# Patient Record
Sex: Male | Born: 1980 | Race: White | Hispanic: No | Marital: Single | Smoking: Current some day smoker
Health system: Southern US, Community
[De-identification: ages and names within clinical notes are randomized; demographics above are authoritative.]

## PROBLEM LIST (undated history)

## (undated) DIAGNOSIS — T8859XA Other complications of anesthesia, initial encounter: Secondary | ICD-10-CM

## (undated) DIAGNOSIS — K219 Gastro-esophageal reflux disease without esophagitis: Secondary | ICD-10-CM

## (undated) DIAGNOSIS — J189 Pneumonia, unspecified organism: Secondary | ICD-10-CM

## (undated) HISTORY — PX: LYMPHADENECTOMY: SHX15

---
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
## 2000-05-09 ENCOUNTER — Ambulatory Visit (HOSPITAL_COMMUNITY): Admission: RE | Admit: 2000-05-09 | Discharge: 2000-05-09 | Payer: Self-pay | Admitting: Orthopedic Surgery

## 2000-05-09 ENCOUNTER — Encounter: Payer: Self-pay | Admitting: Orthopedic Surgery

## 2000-05-23 ENCOUNTER — Ambulatory Visit (HOSPITAL_COMMUNITY): Admission: RE | Admit: 2000-05-23 | Discharge: 2000-05-23 | Payer: Self-pay | Admitting: Orthopedic Surgery

## 2000-05-23 ENCOUNTER — Encounter: Payer: Self-pay | Admitting: Orthopedic Surgery

## 2000-07-30 ENCOUNTER — Encounter: Payer: Self-pay | Admitting: Orthopedic Surgery

## 2000-07-30 ENCOUNTER — Ambulatory Visit (HOSPITAL_COMMUNITY): Admission: RE | Admit: 2000-07-30 | Discharge: 2000-07-30 | Payer: Self-pay | Admitting: Orthopedic Surgery

## 2000-10-28 ENCOUNTER — Emergency Department (HOSPITAL_COMMUNITY): Admission: EM | Admit: 2000-10-28 | Discharge: 2000-10-28 | Payer: Self-pay | Admitting: Emergency Medicine

## 2001-01-05 ENCOUNTER — Emergency Department (HOSPITAL_COMMUNITY): Admission: AC | Admit: 2001-01-05 | Discharge: 2001-01-05 | Payer: Self-pay

## 2001-03-17 ENCOUNTER — Encounter: Admission: RE | Admit: 2001-03-17 | Discharge: 2001-03-17 | Payer: Self-pay | Admitting: Internal Medicine

## 2001-09-29 ENCOUNTER — Encounter: Payer: Self-pay | Admitting: Emergency Medicine

## 2001-09-29 ENCOUNTER — Emergency Department (HOSPITAL_COMMUNITY): Admission: EM | Admit: 2001-09-29 | Discharge: 2001-09-29 | Payer: Self-pay | Admitting: Emergency Medicine

## 2002-03-16 ENCOUNTER — Emergency Department (HOSPITAL_COMMUNITY): Admission: EM | Admit: 2002-03-16 | Discharge: 2002-03-16 | Payer: Self-pay

## 2003-01-13 ENCOUNTER — Encounter: Payer: Self-pay | Admitting: Emergency Medicine

## 2003-01-13 ENCOUNTER — Emergency Department (HOSPITAL_COMMUNITY): Admission: AC | Admit: 2003-01-13 | Discharge: 2003-01-13 | Payer: Self-pay

## 2003-05-03 ENCOUNTER — Emergency Department (HOSPITAL_COMMUNITY): Admission: AD | Admit: 2003-05-03 | Discharge: 2003-05-03 | Payer: Self-pay | Admitting: Family Medicine

## 2003-08-07 ENCOUNTER — Emergency Department (HOSPITAL_COMMUNITY): Admission: AD | Admit: 2003-08-07 | Discharge: 2003-08-07 | Payer: Self-pay | Admitting: Family Medicine

## 2003-09-15 ENCOUNTER — Emergency Department (HOSPITAL_COMMUNITY): Admission: AD | Admit: 2003-09-15 | Discharge: 2003-09-15 | Payer: Self-pay | Admitting: Family Medicine

## 2004-03-31 ENCOUNTER — Emergency Department (HOSPITAL_COMMUNITY): Admission: EM | Admit: 2004-03-31 | Discharge: 2004-03-31 | Payer: Self-pay | Admitting: Emergency Medicine

## 2004-09-09 ENCOUNTER — Emergency Department (HOSPITAL_COMMUNITY): Admission: EM | Admit: 2004-09-09 | Discharge: 2004-09-09 | Payer: Self-pay | Admitting: Family Medicine

## 2005-03-06 ENCOUNTER — Emergency Department (HOSPITAL_COMMUNITY): Admission: EM | Admit: 2005-03-06 | Discharge: 2005-03-06 | Payer: Self-pay | Admitting: *Deleted

## 2005-04-05 ENCOUNTER — Emergency Department (HOSPITAL_COMMUNITY): Admission: AD | Admit: 2005-04-05 | Discharge: 2005-04-05 | Payer: Self-pay | Admitting: Family Medicine

## 2005-05-16 ENCOUNTER — Emergency Department (HOSPITAL_COMMUNITY): Admission: EM | Admit: 2005-05-16 | Discharge: 2005-05-16 | Payer: Self-pay | Admitting: *Deleted

## 2005-12-21 ENCOUNTER — Emergency Department (HOSPITAL_COMMUNITY): Admission: EM | Admit: 2005-12-21 | Discharge: 2005-12-21 | Payer: Self-pay | Admitting: Family Medicine

## 2006-06-16 ENCOUNTER — Ambulatory Visit: Payer: Self-pay | Admitting: Internal Medicine

## 2006-06-22 ENCOUNTER — Encounter: Admission: RE | Admit: 2006-06-22 | Discharge: 2006-06-22 | Payer: Self-pay | Admitting: Internal Medicine

## 2007-02-02 ENCOUNTER — Emergency Department (HOSPITAL_COMMUNITY): Admission: EM | Admit: 2007-02-02 | Discharge: 2007-02-02 | Payer: Self-pay | Admitting: Family Medicine

## 2007-02-07 ENCOUNTER — Emergency Department (HOSPITAL_COMMUNITY): Admission: EM | Admit: 2007-02-07 | Discharge: 2007-02-07 | Payer: Self-pay | Admitting: Emergency Medicine

## 2007-02-14 ENCOUNTER — Emergency Department (HOSPITAL_COMMUNITY): Admission: EM | Admit: 2007-02-14 | Discharge: 2007-02-14 | Payer: Self-pay | Admitting: Emergency Medicine

## 2007-02-18 ENCOUNTER — Emergency Department (HOSPITAL_COMMUNITY): Admission: EM | Admit: 2007-02-18 | Discharge: 2007-02-18 | Payer: Self-pay | Admitting: Emergency Medicine

## 2007-12-01 ENCOUNTER — Emergency Department (HOSPITAL_COMMUNITY): Admission: EM | Admit: 2007-12-01 | Discharge: 2007-12-01 | Payer: Self-pay | Admitting: Family Medicine

## 2009-04-08 ENCOUNTER — Ambulatory Visit: Payer: Self-pay | Admitting: Interventional Radiology

## 2009-04-08 ENCOUNTER — Emergency Department (HOSPITAL_BASED_OUTPATIENT_CLINIC_OR_DEPARTMENT_OTHER): Admission: EM | Admit: 2009-04-08 | Discharge: 2009-04-08 | Payer: Self-pay | Admitting: Emergency Medicine

## 2009-11-15 ENCOUNTER — Emergency Department: Payer: Self-pay | Admitting: Emergency Medicine

## 2010-03-19 ENCOUNTER — Emergency Department (HOSPITAL_COMMUNITY): Admission: EM | Admit: 2010-03-19 | Discharge: 2010-03-19 | Payer: Self-pay | Admitting: Emergency Medicine

## 2010-05-18 ENCOUNTER — Emergency Department (HOSPITAL_COMMUNITY): Admission: EM | Admit: 2010-05-18 | Discharge: 2010-05-18 | Payer: Self-pay | Admitting: Family Medicine

## 2010-09-18 ENCOUNTER — Emergency Department (HOSPITAL_COMMUNITY)
Admission: EM | Admit: 2010-09-18 | Discharge: 2010-09-18 | Disposition: A | Discharge status: Home / Self Care | Payer: Self-pay | Attending: Emergency Medicine | Admitting: Emergency Medicine

## 2010-09-18 DIAGNOSIS — J069 Acute upper respiratory infection, unspecified: Secondary | ICD-10-CM | POA: Insufficient documentation

## 2010-09-18 DIAGNOSIS — B9789 Other viral agents as the cause of diseases classified elsewhere: Secondary | ICD-10-CM | POA: Insufficient documentation

## 2010-09-18 DIAGNOSIS — J3489 Other specified disorders of nose and nasal sinuses: Secondary | ICD-10-CM | POA: Insufficient documentation

## 2010-09-18 DIAGNOSIS — L2989 Other pruritus: Secondary | ICD-10-CM | POA: Insufficient documentation

## 2010-09-18 DIAGNOSIS — R070 Pain in throat: Secondary | ICD-10-CM | POA: Insufficient documentation

## 2010-09-18 DIAGNOSIS — H5789 Other specified disorders of eye and adnexa: Secondary | ICD-10-CM | POA: Insufficient documentation

## 2010-09-18 DIAGNOSIS — L298 Other pruritus: Secondary | ICD-10-CM | POA: Insufficient documentation

## 2010-09-18 LAB — RAPID STREP SCREEN (MED CTR MEBANE ONLY): Streptococcus, Group A Screen (Direct): NEGATIVE

## 2010-10-03 LAB — POCT I-STAT, CHEM 8
BUN: 11 mg/dL (ref 6–23)
Chloride: 103 mEq/L (ref 96–112)
Creatinine, Ser: 1.2 mg/dL (ref 0.4–1.5)
TCO2: 29 mmol/L (ref 0–100)

## 2010-10-03 LAB — DIFFERENTIAL
Lymphocytes Relative: 25 % (ref 12–46)
Monocytes Absolute: 0.7 10*3/uL (ref 0.1–1.0)
Neutrophils Relative %: 67 % (ref 43–77)

## 2010-10-03 LAB — CBC
HCT: 48 % (ref 39.0–52.0)
Hemoglobin: 17 g/dL (ref 13.0–17.0)
RBC: 5.14 MIL/uL (ref 4.22–5.81)
WBC: 9.8 10*3/uL (ref 4.0–10.5)

## 2010-10-24 LAB — URINALYSIS, ROUTINE W REFLEX MICROSCOPIC
Ketones, ur: 15 mg/dL — AB
Leukocytes, UA: NEGATIVE
Nitrite: NEGATIVE
Protein, ur: NEGATIVE mg/dL
Specific Gravity, Urine: 1.036 — ABNORMAL HIGH (ref 1.005–1.030)
Urobilinogen, UA: 1 mg/dL (ref 0.0–1.0)

## 2010-10-24 LAB — URINE MICROSCOPIC-ADD ON

## 2011-03-29 ENCOUNTER — Emergency Department (HOSPITAL_COMMUNITY)
Admission: EM | Admit: 2011-03-29 | Discharge: 2011-03-29 | Disposition: A | Discharge status: Home / Self Care | Payer: Self-pay | Attending: Emergency Medicine | Admitting: Emergency Medicine

## 2011-03-29 ENCOUNTER — Emergency Department (HOSPITAL_COMMUNITY): Payer: Self-pay

## 2011-03-29 DIAGNOSIS — R0602 Shortness of breath: Secondary | ICD-10-CM | POA: Insufficient documentation

## 2011-03-29 DIAGNOSIS — R0789 Other chest pain: Secondary | ICD-10-CM | POA: Insufficient documentation

## 2011-03-29 DIAGNOSIS — R42 Dizziness and giddiness: Secondary | ICD-10-CM | POA: Insufficient documentation

## 2011-03-29 DIAGNOSIS — R110 Nausea: Secondary | ICD-10-CM | POA: Insufficient documentation

## 2011-03-29 LAB — POCT I-STAT, CHEM 8
Calcium, Ion: 1.19 mmol/L (ref 1.12–1.32)
Chloride: 104 mEq/L (ref 96–112)
HCT: 49 % (ref 39.0–52.0)
Potassium: 4.2 mEq/L (ref 3.5–5.1)
Sodium: 138 mEq/L (ref 135–145)

## 2011-03-29 LAB — POCT I-STAT TROPONIN I: Troponin i, poc: 0 ng/mL (ref 0.00–0.08)

## 2011-04-02 ENCOUNTER — Emergency Department (HOSPITAL_COMMUNITY)
Admission: EM | Admit: 2011-04-02 | Discharge: 2011-04-03 | Disposition: A | Discharge status: Home / Self Care | Payer: Self-pay | Attending: Emergency Medicine | Admitting: Emergency Medicine

## 2011-04-02 DIAGNOSIS — N5089 Other specified disorders of the male genital organs: Secondary | ICD-10-CM | POA: Insufficient documentation

## 2011-04-02 DIAGNOSIS — R109 Unspecified abdominal pain: Secondary | ICD-10-CM | POA: Insufficient documentation

## 2011-04-02 DIAGNOSIS — N509 Disorder of male genital organs, unspecified: Secondary | ICD-10-CM | POA: Insufficient documentation

## 2011-04-02 LAB — URINALYSIS, ROUTINE W REFLEX MICROSCOPIC
Protein, ur: NEGATIVE mg/dL
Urobilinogen, UA: 1 mg/dL (ref 0.0–1.0)

## 2011-04-02 LAB — URINE MICROSCOPIC-ADD ON

## 2011-04-04 LAB — URINE CULTURE: Culture  Setup Time: 201209140607

## 2012-02-14 ENCOUNTER — Emergency Department (HOSPITAL_COMMUNITY)
Admission: EM | Admit: 2012-02-14 | Discharge: 2012-02-14 | Disposition: A | Discharge status: Home / Self Care | Payer: Self-pay | Attending: Emergency Medicine | Admitting: Emergency Medicine

## 2012-02-14 ENCOUNTER — Encounter (HOSPITAL_COMMUNITY): Payer: Self-pay | Admitting: *Deleted

## 2012-02-14 DIAGNOSIS — F17200 Nicotine dependence, unspecified, uncomplicated: Secondary | ICD-10-CM | POA: Insufficient documentation

## 2012-02-14 DIAGNOSIS — R12 Heartburn: Secondary | ICD-10-CM | POA: Insufficient documentation

## 2012-02-14 LAB — COMPREHENSIVE METABOLIC PANEL
Alkaline Phosphatase: 69 U/L (ref 39–117)
BUN: 11 mg/dL (ref 6–23)
Calcium: 9.6 mg/dL (ref 8.4–10.5)
GFR calc Af Amer: >90 mL/min (ref >90)
GFR calc non Af Amer: >90 mL/min (ref >90)
Glucose, Bld: 89 mg/dL (ref 70–99)
Total Protein: 8 g/dL (ref 6.0–8.3)

## 2012-02-14 LAB — URINALYSIS, ROUTINE W REFLEX MICROSCOPIC
Bilirubin Urine: NEGATIVE
Glucose, UA: NEGATIVE mg/dL
Hgb urine dipstick: NEGATIVE
Ketones, ur: NEGATIVE mg/dL
pH: 6 (ref 5.0–8.0)

## 2012-02-14 LAB — CBC
HCT: 46.8 % (ref 39.0–52.0)
Hemoglobin: 16.1 g/dL (ref 13.0–17.0)
MCH: 31.9 pg (ref 26.0–34.0)
MCHC: 34.4 g/dL (ref 30.0–36.0)

## 2012-02-14 MED ORDER — PANTOPRAZOLE SODIUM 40 MG PO TBEC: 40.0000 mg | DELAYED_RELEASE_TABLET | Freq: Every day | ORAL | Status: DC

## 2012-02-14 MED ORDER — DICYCLOMINE HCL 20 MG PO TABS: 20.0000 mg | ORAL_TABLET | Freq: Two times a day (BID) | ORAL | Status: DC

## 2012-02-14 NOTE — ED Notes (Signed)
Reports having generalized abd pain and diarrhea, body chills, reports recently having small amount of blood in his stools. Denies vomiting. No acute distress noted at triage.

## 2012-02-14 NOTE — ED Provider Notes (Signed)
History     CSN: [id number]  Arrival date & time 02/14/12  1702   First MD Initiated Contact with Patient 02/14/12 2239     11:10 PM HPI Pt reports for the last few weeks has had intermittent upper abdominal pain. Describes as burning pain. Reports currently is having absolutely no symptoms. States every so often will have blood in stools the last stool was normal. States pain is worse occasionally after having fast food. Denies melena, nausea, vomiting, fever, diarrhea, back pain. 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  Patient is a 31 y.o. male presenting with abdominal pain. The history is provided by the patient.  Abdominal Pain The primary symptoms of the illness include abdominal pain. The primary symptoms of the illness do not include fever, shortness of breath, nausea, vomiting, diarrhea, hematemesis or dysuria. The current episode started more than 2 days ago. The onset of the illness was gradual. The problem has not changed since onset. The abdominal pain is located in the epigastric region. The abdominal pain does not radiate. The severity of the abdominal pain is 0/10. The abdominal pain is exacerbated by eating.  The illness is associated with eating. The patient has not had a change in bowel habit. Symptoms associated with the illness do not include chills, anorexia, diaphoresis, constipation, urgency, hematuria, frequency or back pain.    History reviewed. No pertinent past medical history.  History reviewed. No pertinent past surgical history.  History reviewed. No pertinent family history.  History  Substance Use Topics  . Smoking status: Current Everyday Smoker    Types: Cigarettes  . Smokeless tobacco: Not on file  . Alcohol Use: Yes     occ      Review of Systems  Constitutional: Negative for fever, chills and diaphoresis.  Respiratory: Negative for shortness of breath.     Cardiovascular: Negative for chest pain.  Gastrointestinal: Positive for abdominal pain. Negative for nausea, vomiting, diarrhea, constipation, blood in stool, rectal pain, anorexia and hematemesis.  Genitourinary: Negative for dysuria, urgency, frequency, hematuria, flank pain, discharge, penile pain and testicular pain.  Musculoskeletal: Negative for back pain.  Neurological: Negative for dizziness, weakness, numbness and headaches.  All other systems reviewed and are negative.    Allergies  Review of patient's allergies indicates no known allergies.  Home Medications   Current Outpatient Rx  Name Route Sig Dispense Refill  . LOPERAMIDE HCL 2 MG PO CAPS Oral Take 2 mg by mouth 4 (four) times daily as needed. For diarrhea      BP 137/82  Pulse 73  Temp 98.3 F (36.8 C) (Oral)  Resp 18  SpO2 100%  Physical Exam  Vitals reviewed. Constitutional: He is oriented to person, place, and time. He appears well-developed and well-nourished.  HENT:  Head: Normocephalic and atraumatic.  Eyes: Conjunctivae are normal. Pupils are equal, round, and reactive to light.  Neck: Normal range of motion. Neck supple.  Cardiovascular: Normal rate, regular rhythm and normal heart sounds.   Pulmonary/Chest: Effort normal and breath sounds normal.  Abdominal: Soft. Bowel sounds are normal. He exhibits no distension and no mass. There is no tenderness. There is no rebound and no guarding.  Neurological: He is alert and oriented to person, place, and time.  Skin: Skin is warm and dry. No rash noted. No erythema. No pallor.  Psychiatric: He has a normal mood and affect. His behavior  is normal.    ED Course  Procedures   Results for orders placed during the hospital encounter of 02/14/12  URINALYSIS, ROUTINE W REFLEX MICROSCOPIC      Component Value Range   Color, Urine YELLOW  YELLOW   APPearance CLEAR  CLEAR   Specific Gravity, Urine 1.021  1.005 - 1.030   pH 6.0  5.0 - 8.0   Glucose, UA  NEGATIVE  NEGATIVE mg/dL   Hgb urine dipstick NEGATIVE  NEGATIVE   Bilirubin Urine NEGATIVE  NEGATIVE   Ketones, ur NEGATIVE  NEGATIVE mg/dL   Protein, ur NEGATIVE  NEGATIVE mg/dL   Urobilinogen, UA 0.2  0.0 - 1.0 mg/dL   Nitrite NEGATIVE  NEGATIVE   Leukocytes, UA NEGATIVE  NEGATIVE  CBC      Component Value Range   WBC 9.9  4.0 - 10.5 K/uL   RBC 5.05  4.22 - 5.81 MIL/uL   Hemoglobin 16.1  13.0 - 17.0 g/dL   HCT 16.1  09.6 - 04.5 %   MCV 92.7  78.0 - 100.0 fL   MCH 31.9  26.0 - 34.0 pg   MCHC 34.4  30.0 - 36.0 g/dL   RDW 40.9  81.1 - 91.4 %   Platelets 247  150 - 400 K/uL  COMPREHENSIVE METABOLIC PANEL      Component Value Range   Sodium 138  135 - 145 mEq/L   Potassium 4.3  3.5 - 5.1 mEq/L   Chloride 102  96 - 112 mEq/L   CO2 27  19 - 32 mEq/L   Glucose, Bld 89  70 - 99 mg/dL   BUN 11  6 - 23 mg/dL   Creatinine, Ser 7.82  0.50 - 1.35 mg/dL   Calcium 9.6  8.4 - 95.6 mg/dL   Total Protein 8.0  6.0 - 8.3 g/dL   Albumin 4.2  3.5 - 5.2 g/dL   AST 32  0 - 37 U/L   ALT 43  0 - 53 U/L   Alkaline Phosphatase 69  39 - 117 U/L   Total Bilirubin 0.3  0.3 - 1.2 mg/dL   GFR calc non Af Amer >90  >90 mL/min   GFR calc Af Amer >90  >90 mL/min    MDM    Patient is completely asymptomatic. States pain is mildly occurs but he just requires a medical physician. CBC, CMP, and urinalysis are all normal. I do not feel we need further testing. 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 Recommended if symptoms worsen he may always return to the nearest emergency room for further evaluation or feel that he may be discharged with prescription for Protonix and Carafate. Discussed with patient he voices understanding and is ready for discharge       Thomasene Lot, Cordelia Poche 02/14/12 2321

## 2012-02-15 NOTE — ED Provider Notes (Signed)
Medical screening examination/treatment/procedure(s) were performed by non-physician practitioner and as supervising physician I was immediately available for consultation/collaboration.   Carleene Cooper III, MD 02/15/12 1351

## 2012-05-08 ENCOUNTER — Emergency Department (HOSPITAL_COMMUNITY): Admission: EM | Admit: 2012-05-08 | Discharge: 2012-05-08 | Payer: Self-pay | Source: Home / Self Care

## 2012-05-08 ENCOUNTER — Emergency Department (INDEPENDENT_AMBULATORY_CARE_PROVIDER_SITE_OTHER)
Admission: EM | Admit: 2012-05-08 | Discharge: 2012-05-08 | Disposition: A | Discharge status: Home / Self Care | Payer: Self-pay | Source: Home / Self Care | Attending: Emergency Medicine | Admitting: Emergency Medicine

## 2012-05-08 ENCOUNTER — Emergency Department (INDEPENDENT_AMBULATORY_CARE_PROVIDER_SITE_OTHER): Payer: Self-pay

## 2012-05-08 ENCOUNTER — Encounter (HOSPITAL_COMMUNITY): Payer: Self-pay | Admitting: Emergency Medicine

## 2012-05-08 DIAGNOSIS — J208 Acute bronchitis due to other specified organisms: Secondary | ICD-10-CM

## 2012-05-08 DIAGNOSIS — J209 Acute bronchitis, unspecified: Secondary | ICD-10-CM

## 2012-05-08 MED ORDER — BENZONATATE 200 MG PO CAPS: 200.0000 mg | ORAL_CAPSULE | Freq: Three times a day (TID) | ORAL | Status: DC | PRN

## 2012-05-08 MED ORDER — PREDNISONE 5 MG PO KIT: 1.0000 | PACK | Freq: Every day | ORAL | Status: DC

## 2012-05-08 MED ORDER — ALBUTEROL SULFATE HFA 108 (90 BASE) MCG/ACT IN AERS
INHALATION_SPRAY | RESPIRATORY_TRACT | Status: AC
  Filled 2012-05-08: qty 6.7

## 2012-05-08 MED ORDER — ALBUTEROL SULFATE HFA 108 (90 BASE) MCG/ACT IN AERS
2.0000 | INHALATION_SPRAY | RESPIRATORY_TRACT | Status: DC | PRN
  Administered 2012-05-08: 2 via RESPIRATORY_TRACT

## 2012-05-08 NOTE — ED Provider Notes (Signed)
Chief Complaint  Patient presents with  . Cough    History of Present Illness:   Paul Newton is a 31 year old male with a two-day history of a cough productive of white to yellow sputum, wheezing, and chest tightness. He denies any history of asthma. He does smoke a pack of cigarettes per day but would like to. He notes sternal chest pain which is worse with coughing. It radiates to the submammary area in the left. He feels dizzy and lightheaded, has had some sweats, and headache. He denies any nasal congestion, rhinorrhea, sore throat, or GI complaints.  Review of Systems:  Other than noted above, the patient denies any of the following symptoms. Systemic:  No fever, chills, sweats, fatigue, myalgias, headache, or anorexia. Eye:  No redness, pain or drainage. ENT:  No earache, ear congestion, nasal congestion, sneezing, rhinorrhea, sinus pressure, sinus pain, post nasal drip, or sore throat. Lungs:  No cough, sputum production, wheezing, shortness of breath, or chest pain. GI:  No abdominal pain, nausea, vomiting, or diarrhea.  PMFSH:  Past medical history, family history, social history, meds, and allergies were reviewed.  Physical Exam:   Vital signs:  BP 119/60  Pulse 100  Temp 99.2 F (37.3 C)  Resp 19  SpO2 98% General:  Alert, in no distress. Eye:  No conjunctival injection or drainage. Lids were normal. ENT:  TMs and canals were normal, without erythema or inflammation.  Nasal mucosa was clear and uncongested, without drainage.  Mucous membranes were moist.  Pharynx was clear, without exudate or drainage.  There were no oral ulcerations or lesions. Neck:  Supple, no adenopathy, tenderness or mass. Lungs:  No respiratory distress.  Lungs showed soft, widely scattered, expiratory wheezes bilaterally, there were no rales or rhonchi.  Heart:  Regular rhythm, without gallops, murmers or rubs. Skin:  Clear, warm, and dry, without rash or lesions.  Radiology:  Dg Chest 2 View  05/08/2012   *RADIOLOGY REPORT*  Clinical Data: Cough, wheezing  CHEST - 2 VIEW  Comparison: 03/29/2011  Findings: Cardiomediastinal silhouette is stable.  No acute infiltrate or pleural effusion.  No pulmonary edema.  Bony thorax is stable.  IMPRESSION: No active disease.   Original Report Authenticated By: Natasha Mead, M.D.    I reviewed the images independently and personally and concur with the radiologist's findings.  Course in Urgent Care Center:   He was given an albuterol HFA inhaler with instructions to use 2 puffs 4 times a day.  Assessment:  The encounter diagnosis was Viral bronchitis.  Plan:   1.  The following meds were prescribed:   New Prescriptions   BENZONATATE (TESSALON) 200 MG CAPSULE    Take 1 capsule (200 mg total) by mouth 3 (three) times daily as needed for cough.   PREDNISONE 5 MG KIT    Take 1 kit (5 mg total) by mouth daily after breakfast. Prednisone 5 mg 6 day dosepack.  Take as directed.   2.  The patient was instructed in symptomatic care and handouts were given. 3.  The patient was told to return if becoming worse in any way, if no better in 3 or 4 days, and given some red flag symptoms that would indicate earlier return.   Reuben Likes, MD 05/08/12 2119

## 2012-05-08 NOTE — ED Notes (Signed)
Pt c/o cough x1 day... Sx include: cough w/white/yellow sputum, wheezing, chest pain that starts at center and radiates to left side, dizziness, light headed.... Pt is alert sitting straight up on exam table w/sig other waiting in the room w/pt.

## 2012-05-13 NOTE — ED Notes (Signed)
Work noted printed and left at front desk for pt

## 2012-07-06 ENCOUNTER — Encounter (HOSPITAL_COMMUNITY): Payer: Self-pay

## 2012-07-06 ENCOUNTER — Emergency Department (INDEPENDENT_AMBULATORY_CARE_PROVIDER_SITE_OTHER)
Admission: EM | Admit: 2012-07-06 | Discharge: 2012-07-06 | Disposition: A | Discharge status: Home / Self Care | Payer: Self-pay | Source: Home / Self Care

## 2012-07-06 DIAGNOSIS — J019 Acute sinusitis, unspecified: Secondary | ICD-10-CM

## 2012-07-06 DIAGNOSIS — F17200 Nicotine dependence, unspecified, uncomplicated: Secondary | ICD-10-CM

## 2012-07-06 DIAGNOSIS — J40 Bronchitis, not specified as acute or chronic: Secondary | ICD-10-CM

## 2012-07-06 DIAGNOSIS — J309 Allergic rhinitis, unspecified: Secondary | ICD-10-CM

## 2012-07-06 DIAGNOSIS — H6590 Unspecified nonsuppurative otitis media, unspecified ear: Secondary | ICD-10-CM

## 2012-07-06 MED ORDER — ALBUTEROL SULFATE (5 MG/ML) 0.5% IN NEBU
INHALATION_SOLUTION | RESPIRATORY_TRACT | Status: AC
  Filled 2012-07-06: qty 1

## 2012-07-06 MED ORDER — FLUTICASONE PROPIONATE 50 MCG/ACT NA SUSP: 2.0000 | Freq: Every day | NASAL | Status: DC

## 2012-07-06 MED ORDER — DOXYCYCLINE HYCLATE 100 MG PO TABS: 100.0000 mg | ORAL_TABLET | Freq: Two times a day (BID) | ORAL | Status: DC

## 2012-07-06 MED ORDER — ALBUTEROL SULFATE HFA 108 (90 BASE) MCG/ACT IN AERS: 2.0000 | INHALATION_SPRAY | Freq: Four times a day (QID) | RESPIRATORY_TRACT | Status: DC | PRN

## 2012-07-06 MED ORDER — FEXOFENADINE-PSEUDOEPHED ER 180-240 MG PO TB24: 1.0000 | ORAL_TABLET | Freq: Every day | ORAL | Status: DC

## 2012-07-06 MED ORDER — IPRATROPIUM-ALBUTEROL 0.5-2.5 (3) MG/3ML IN SOLN
3.0000 mL | Freq: Once | RESPIRATORY_TRACT | Status: AC
  Administered 2012-07-06: 3 mL via RESPIRATORY_TRACT

## 2012-07-06 NOTE — Discharge Instructions (Signed)
Allergic Rhinitis Allergic rhinitis is when the mucous membranes in the nose respond to allergens. Allergens are particles in the air that cause your body to have an allergic reaction. This causes you to release allergic antibodies. Through a chain of events, these eventually cause you to release histamine into the blood stream (hence the use of antihistamines). Although meant to be protective to the body, it is this release that causes your discomfort, such as frequent sneezing, congestion and an itchy runny nose.  CAUSES  The pollen allergens may come from grasses, trees, and weeds. This is seasonal allergic rhinitis, or "hay fever." Other allergens cause year-round allergic rhinitis (perennial allergic rhinitis) such as house dust mite allergen, pet dander and mold spores.  SYMPTOMS   Nasal stuffiness (congestion).  Runny, itchy nose with sneezing and tearing of the eyes.  There is often an itching of the mouth, eyes and ears. It cannot be cured, but it can be controlled with medications. DIAGNOSIS  If you are unable to determine the offending allergen, skin or blood testing may find it. TREATMENT   Avoid the allergen.  Medications and allergy shots (immunotherapy) can help.  Hay fever may often be treated with antihistamines in pill or nasal spray forms. Antihistamines block the effects of histamine. There are over-the-counter medicines that may help with nasal congestion and swelling around the eyes. Check with your caregiver before taking or giving this medicine. If the treatment above does not work, there are many new medications your caregiver can prescribe. Stronger medications may be used if initial measures are ineffective. Desensitizing injections can be used if medications and avoidance fails. Desensitization is when a patient is given ongoing shots until the body becomes less sensitive to the allergen. Make sure you follow up with your caregiver if problems continue. SEEK MEDICAL  CARE IF:   You develop fever (more than 100.5 F (38.1 C).  You develop a cough that does not stop easily (persistent).  You have shortness of breath.  You start wheezing.  Symptoms interfere with normal daily activities. Document Released: 03/31/2001 Document Revised: 09/28/2011 Document Reviewed: 10/10/2008 [facility] Patient Information 2013 [place], [place]. Bronchitis Bronchitis is a problem of the air tubes leading to your lungs. This problem makes it hard for air to get in and out of the lungs. You may cough a lot because your air tubes are narrow. Going without care can cause lasting (chronic) bronchitis. HOME CARE   Drink enough fluids to keep your pee (urine) clear or pale yellow.  Use a cool mist humidifier.  Quit smoking if you smoke. If you keep smoking, the bronchitis might not get better.  Only take medicine as told by your doctor. GET HELP RIGHT AWAY IF:   Coughing keeps you awake.  You start to wheeze.  You become more sick or weak.  You have a hard time breathing or get short of breath.  You cough up blood.  Coughing lasts more than 2 weeks.  You have a fever.  Your baby is older than 3 months with a rectal temperature of 102 F (38.9 C) or higher.  Your baby is 62 months old or younger with a rectal temperature of 100.4 F (38 C) or higher. MAKE SURE YOU:  Understand these instructions.  Will watch your condition.  Will get help right away if you are not doing well or get worse. Document Released: 12/23/2007 Document Revised: 09/28/2011 Document Reviewed: 06/07/2009 [facility] Patient Information 2013 [place], [place]. Sinusitis Sinusitis an infection of  the air pockets (sinuses) in your face. This can cause puffiness (swelling). It can also cause drainage from your sinuses.  HOME CARE   Only take medicine as told by your doctor.  Drink enough fluids to keep your pee (urine) clear or pale yellow.  Apply moist heat or ice packs for pain  relief.  Use salt (saline) nose sprays. The spray will wet the thick fluid in the nose. This can help the sinuses drain. GET HELP RIGHT AWAY IF:   You have a fever.  Your baby is older than 3 months with a rectal temperature of 102 F (38.9 C) or higher.  Your baby is 29 months old or younger with a rectal temperature of 100.4 F (38 C) or higher.  The pain gets worse.  You get a very bad headache.  You keep throwing up (vomiting).  Your face gets puffy. MAKE SURE YOU:   Understand these instructions.  Will watch your condition.  Will get help right away if you are not doing well or get worse. Document Released: 12/23/2007 Document Revised: 09/28/2011 Document Reviewed: 12/23/2007 [facility] Patient Information 2013 [place], [place].

## 2012-07-06 NOTE — ED Notes (Signed)
C/o cough vomiting aches hot and cold sweats. Chest hurst on left side from coughing

## 2012-07-06 NOTE — ED Provider Notes (Signed)
History    CSN: [id number]  Arrival date & time 07/06/12  1626  Chief Complaint  Patient presents with  . Cough   HPI  Pt says that he is having hot and cold spells, and vomiting, cough, dry cough, pt reports that he is dizzy and feels aching,  No known sick contacts, no sinus pressure or drainage, no SOB, pt says that he has been wheezing,  Pt says that he is smoking but has not been able to smoke for past 2 days.  Pt having sensation of left ear fullness.  Pt has a history of pneumonia.    History reviewed. No pertinent past medical history.  Past Surgical History  Procedure Date  . Lymphadenectomy     No family history on file.  History  Substance Use Topics  . Smoking status: Current Every Day Smoker    Types: Cigarettes  . Smokeless tobacco: Not on file  . Alcohol Use: Yes     Comment: occ    Review of Systems  Constitutional: Positive for chills, appetite change and fatigue. Negative for unexpected weight change.  HENT: Positive for hearing loss, ear pain, congestion, rhinorrhea, sneezing and postnasal drip. Negative for nosebleeds, facial swelling, neck pain, neck stiffness, tinnitus and ear discharge.   Eyes: Negative.   Respiratory: Positive for cough, chest tightness, shortness of breath and wheezing. Negative for stridor.   Cardiovascular: Positive for chest pain. Negative for palpitations and leg swelling.  Gastrointestinal: Negative.   Genitourinary: Negative.   Musculoskeletal: Negative.   Neurological: Negative.   Hematological: Negative.   Psychiatric/Behavioral: Negative.     Allergies  Review of patient's allergies indicates no known allergies.  Home Medications   OTC motrin and sinus medications   BP 141/82  Pulse 100  Temp 99.5 F (37.5 C) (Oral)  Resp 21  SpO2 98%  Physical Exam  Nursing note and vitals reviewed. Constitutional: He is oriented to person, place, and time. He appears well-developed and well-nourished. No distress.   HENT:  Head: Normocephalic and atraumatic.  Eyes: EOM are normal. Pupils are equal, round, and reactive to light.  Neck: Normal range of motion. Neck supple.  Cardiovascular: Normal rate, regular rhythm and normal heart sounds.   Pulmonary/Chest: Effort normal. No respiratory distress. He has wheezes. He has no rales. He exhibits no tenderness.  Abdominal: Soft. Bowel sounds are normal. He exhibits no distension.  Musculoskeletal: Normal range of motion. He exhibits no edema and no tenderness.  Neurological: He is alert and oriented to person, place, and time.  Skin: Skin is warm and dry. He is not diaphoretic.  Psychiatric: He has a normal mood and affect. His behavior is normal. Judgment and thought content normal.    ED Course  Procedures (including critical care time)  Labs Reviewed - No data to display No results found.   No diagnosis found.  Duonebs x 2 given in office the patient experienced rapid improvement in symptoms after the second treatment   MDM  IMPRESSION  Acute bronchitis  Acute Sinusitis  Acute Serous Otitis Media  RECOMMENDATIONS / PLAN  Doxycycline 100 mg po bid flonase NS 2 sprays per nostril once daily Allegra D one by mouth daily Ventolin HFA albuterol inhaler ordered Encouraged fluids rest and smoking cessation strongly advised  FOLLOW UP In 2-3 days if no significant improvement  The patient was given clear instructions to go to ER or return to medical center if symptoms don't improve, worsen or new problems develop.  The  patient verbalized understanding.  The patient was told to call to get lab results if they haven't heard anything in the next week.            Cleora Fleet, MD 07/06/12 1925

## 2012-07-07 MED ORDER — SULFAMETHOXAZOLE-TMP DS 800-160 MG PO TABS: 1.0000 | ORAL_TABLET | Freq: Two times a day (BID) | ORAL | Status: DC

## 2012-08-13 ENCOUNTER — Encounter (HOSPITAL_COMMUNITY): Payer: Self-pay | Admitting: *Deleted

## 2012-08-13 ENCOUNTER — Emergency Department (INDEPENDENT_AMBULATORY_CARE_PROVIDER_SITE_OTHER)
Admission: EM | Admit: 2012-08-13 | Discharge: 2012-08-13 | Disposition: A | Discharge status: Home / Self Care | Payer: Self-pay | Source: Home / Self Care | Attending: Family Medicine | Admitting: Family Medicine

## 2012-08-13 DIAGNOSIS — N6002 Solitary cyst of left breast: Secondary | ICD-10-CM

## 2012-08-13 DIAGNOSIS — N6009 Solitary cyst of unspecified breast: Secondary | ICD-10-CM

## 2012-08-13 HISTORY — DX: Pneumonia, unspecified organism: J18.9

## 2012-08-13 NOTE — ED Provider Notes (Signed)
History     CSN: [id number]  Arrival date & time 08/13/12  1127   First MD Initiated Contact with Patient 08/13/12 1137      Chief Complaint  Patient presents with  . Breast Mass    (Consider location/radiation/quality/duration/timing/severity/associated sxs/prior treatment) Patient is a 32 y.o. male presenting with chest pain. The history is provided by the patient.  Chest Pain The chest pain began more  than 1 month ago (noticed left areolar lump for 2 mos. , [facility] but [facility], here for recheck.). The chest pain is unchanged. Associated with: pt drinks a lot of caffeine. Pertinent negatives for primary symptoms include no cough.     Past Medical History  Diagnosis Date  . Pneumonia     Past Surgical History  Procedure Date  . Lymphadenectomy     Right neck R/T swelling with airway compromise during bout with pneumonia    No family history on file.  History  Substance Use Topics  . Smoking status: Current Every Day Smoker -- 1.0 packs/day    Types: Cigarettes  . Smokeless tobacco: Not on file  . Alcohol Use: Yes     Comment: occasional      Review of Systems  Constitutional: Negative.   HENT: Negative.   Respiratory: Negative for cough.   Cardiovascular: Positive for chest pain.    Allergies  Review of patient's allergies indicates no known allergies.  Home Medications   Current Outpatient Rx  Name  Route  Sig  Dispense  Refill  . ALBUTEROL SULFATE HFA 108 (90 BASE) MCG/ACT IN AERS   Inhalation   Inhale 2 puffs into the lungs every 6 (six) hours as needed for wheezing.   1 Inhaler   2   . FEXOFENADINE-PSEUDOEPHED ER 180-240 MG PO TB24   Oral   Take 1 tablet by mouth daily.   14 tablet   0   . FLUTICASONE PROPIONATE 50 MCG/ACT NA SUSP   Nasal   Place 2 sprays into the nose daily.   16 g   2   . SULFAMETHOXAZOLE-TMP DS 800-160 MG PO TABS   Oral   Take 1 tablet by mouth 2 (two) times daily.   20 tablet   0     BP  134/78  Pulse 68  Temp 98.2 F (36.8 C) (Oral)  Resp 16  SpO2 99%  Physical Exam  Nursing note and vitals reviewed. Constitutional: He appears well-developed and well-nourished.  Pulmonary/Chest: He exhibits tenderness. Left breast exhibits tenderness. Left breast exhibits no inverted nipple, no nipple discharge and no skin change. Breasts are symmetrical.    Abdominal: Soft. Bowel sounds are normal.  Skin: Skin is warm and dry.    ED Course  Procedures (including critical care time)  Labs Reviewed - No data to display No results found.   1. Cyst of breast, left, benign solitary       MDM          Linna Hoff, MD 08/13/12 1212

## 2012-08-13 NOTE — ED Notes (Signed)
C/O tender lump to left breast underneath nipple/areola x approx 2 months.  Feels like it may have gotten slightly bigger.  Denies any fevers.  Left breast appears visually unremarkable.

## 2012-08-13 NOTE — ED Notes (Signed)
States he notified provider of left breast lump during last visit to [facility] & was told to "wait a week and return if it doesn't go away".

## 2012-09-19 ENCOUNTER — Emergency Department: Payer: Self-pay | Admitting: Emergency Medicine

## 2012-09-19 LAB — URINALYSIS, COMPLETE
Bacteria: NONE SEEN
Leukocyte Esterase: NEGATIVE
Nitrite: NEGATIVE
Ph: 5 (ref 4.5–8.0)
Protein: NEGATIVE
RBC,UR: 2 /HPF (ref 0–5)
Specific Gravity: 1.032 (ref 1.003–1.030)
Squamous Epithelial: NONE SEEN
WBC UR: 1 /HPF (ref 0–5)

## 2012-09-19 LAB — CBC
HGB: 15.5 g/dL (ref 13.0–18.0)
MCH: 31.4 pg (ref 26.0–34.0)
MCHC: 33.3 g/dL (ref 32.0–36.0)
MCV: 94 fL (ref 80–100)
Platelet: 251 10*3/uL (ref 150–440)
RDW: 14.1 % (ref 11.5–14.5)
WBC: 13.5 10*3/uL — ABNORMAL HIGH (ref 3.8–10.6)

## 2012-09-19 LAB — COMPREHENSIVE METABOLIC PANEL
Albumin: 4.3 g/dL (ref 3.4–5.0)
Anion Gap: 4 — ABNORMAL LOW (ref 7–16)
BUN: 16 mg/dL (ref 7–18)
Bilirubin,Total: 0.4 mg/dL (ref 0.2–1.0)
Calcium, Total: 9 mg/dL (ref 8.5–10.1)
Chloride: 105 mmol/L (ref 98–107)
Creatinine: 1.17 mg/dL (ref 0.60–1.30)
EGFR (African American): >60
EGFR (Non-African Amer.): >60
Glucose: 82 mg/dL (ref 65–99)
Sodium: 137 mmol/L (ref 136–145)
Total Protein: 8.4 g/dL — ABNORMAL HIGH (ref 6.4–8.2)

## 2012-09-19 LAB — LIPASE, BLOOD: Lipase: 79 U/L (ref 73–393)

## 2014-02-11 ENCOUNTER — Emergency Department (HOSPITAL_COMMUNITY)
Admission: EM | Admit: 2014-02-11 | Discharge: 2014-02-12 | Disposition: A | Discharge status: Home / Self Care | Payer: Self-pay | Attending: Emergency Medicine | Admitting: Emergency Medicine

## 2014-02-11 ENCOUNTER — Encounter (HOSPITAL_COMMUNITY): Payer: Self-pay | Admitting: Emergency Medicine

## 2014-02-11 DIAGNOSIS — Z8701 Personal history of pneumonia (recurrent): Secondary | ICD-10-CM | POA: Insufficient documentation

## 2014-02-11 DIAGNOSIS — Z79899 Other long term (current) drug therapy: Secondary | ICD-10-CM | POA: Insufficient documentation

## 2014-02-11 DIAGNOSIS — F17200 Nicotine dependence, unspecified, uncomplicated: Secondary | ICD-10-CM | POA: Insufficient documentation

## 2014-02-11 DIAGNOSIS — K921 Melena: Secondary | ICD-10-CM | POA: Insufficient documentation

## 2014-02-11 DIAGNOSIS — R109 Unspecified abdominal pain: Secondary | ICD-10-CM | POA: Insufficient documentation

## 2014-02-11 DIAGNOSIS — R1084 Generalized abdominal pain: Secondary | ICD-10-CM | POA: Insufficient documentation

## 2014-02-11 LAB — COMPREHENSIVE METABOLIC PANEL
ALK PHOS: 71 U/L (ref 39–117)
ALT: 17 U/L (ref 0–53)
ANION GAP: 15 (ref 5–15)
AST: 18 U/L (ref 0–37)
Albumin: 4.5 g/dL (ref 3.5–5.2)
BILIRUBIN TOTAL: 0.5 mg/dL (ref 0.3–1.2)
BUN: 12 mg/dL (ref 6–23)
CO2: 24 mEq/L (ref 19–32)
CREATININE: 1.03 mg/dL (ref 0.50–1.35)
Calcium: 9.3 mg/dL (ref 8.4–10.5)
Chloride: 100 mEq/L (ref 96–112)
GFR calc non Af Amer: >90 mL/min (ref >90)
GLUCOSE: 87 mg/dL (ref 70–99)
POTASSIUM: 3.8 meq/L (ref 3.7–5.3)
Sodium: 139 mEq/L (ref 137–147)
TOTAL PROTEIN: 7.9 g/dL (ref 6.0–8.3)

## 2014-02-11 LAB — CBC WITH DIFFERENTIAL/PLATELET
BASOS ABS: 0 10*3/uL (ref 0.0–0.1)
BASOS PCT: 0 % (ref 0–1)
Eosinophils Absolute: 0.1 10*3/uL (ref 0.0–0.7)
Eosinophils Relative: 1 % (ref 0–5)
HCT: 44.7 % (ref 39.0–52.0)
HEMOGLOBIN: 15.5 g/dL (ref 13.0–17.0)
Lymphocytes Relative: 29 % (ref 12–46)
Lymphs Abs: 3.2 10*3/uL (ref 0.7–4.0)
MCH: 32 pg (ref 26.0–34.0)
MCHC: 34.7 g/dL (ref 30.0–36.0)
MCV: 92.2 fL (ref 78.0–100.0)
Monocytes Absolute: 0.7 10*3/uL (ref 0.1–1.0)
Monocytes Relative: 7 % (ref 3–12)
NEUTROS ABS: 7 10*3/uL (ref 1.7–7.7)
NEUTROS PCT: 63 % (ref 43–77)
Platelets: 274 10*3/uL (ref 150–400)
RBC: 4.85 MIL/uL (ref 4.22–5.81)
RDW: 12.9 % (ref 11.5–15.5)
WBC: 11 10*3/uL — AB (ref 4.0–10.5)

## 2014-02-11 MED ORDER — SODIUM CHLORIDE 0.9 % IV BOLUS (SEPSIS)
1000.0000 mL | Freq: Once | INTRAVENOUS | Status: AC
  Administered 2014-02-12: 1000 mL via INTRAVENOUS

## 2014-02-11 NOTE — ED Provider Notes (Signed)
CSN: [id number]     Arrival date & time 02/11/14  2125 History   First MD Initiated Contact with Patient 02/11/14 2331     Chief Complaint  Patient presents with  . Abdominal Pain     (Consider location/radiation/quality/duration/timing/severity/associated sxs/prior Treatment) HPI Comments: Pt comes in with cc of abd pain. Patient has had 6 months of intermittent abd pain - mainly LLQ and RUQ. Pt started having the current pain about 4 days ago - constant, sharp - with nausea no emesis and loose BM 5-6 times a day. Pt has no hx of IBD, there is no family hx of IBD. Pt has no abd surgeries. No weight loss. No travel hx.  Patient is a 33 y.o. male presenting with abdominal pain. The history is provided by the patient.  Abdominal Pain Associated symptoms: diarrhea and nausea   Associated symptoms: no chest pain, no chills, no cough, no dysuria, no fever and no shortness of breath     Past Medical History  Diagnosis Date  . Pneumonia    Past Surgical History  Procedure Laterality Date  . Lymphadenectomy      Right neck R/T swelling with airway compromise during bout with pneumonia   History reviewed. No pertinent family history. History  Substance Use Topics  . Smoking status: Current Every Day Smoker -- 1.00 packs/day    Types: Cigarettes  . Smokeless tobacco: Not on file  . Alcohol Use: Yes     Comment: occasional    Review of Systems  Constitutional: Negative for fever, chills, activity change and appetite change.  Respiratory: Negative for cough and shortness of breath.   Cardiovascular: Negative for chest pain.  Gastrointestinal: Positive for nausea, abdominal pain, diarrhea and blood in stool.  Genitourinary: Negative for dysuria.  Musculoskeletal: Negative for arthralgias and myalgias.  Skin: Negative for rash.  Allergic/Immunologic: Negative for immunocompromised state.      Allergies  Review of patient's allergies indicates no known allergies.  Home  Medications   Prior to Admission medications   Medication Sig Start Date End Date Taking? Authorizing Provider  albuterol (PROVENTIL HFA;VENTOLIN HFA) 108 (90 BASE) MCG/ACT inhaler Inhale 2 puffs into the lungs every 6 (six) hours as needed for wheezing. 07/06/12  Yes Cl[place]ford Cyndie MullL Johnson, MD  Probiotic Product (PROBIOTIC DAILY PO) Take 1 tablet by mouth daily.   Yes Historical Provider, MD  ibuprofen (ADVIL,MOTRIN) 600 MG tablet Take 1 tablet (600 mg total) by mouth every 6 (six) hours as needed. 02/12/14   Ivalee Strauser Rhunette CroftNanavati, MD   BP 120/73  Pulse 74  Temp(Src) 97.9 F (36.6 C) (Oral)  Resp 18  Ht 5\' 8"  (1.727 m)  Wt 180 lb (81.647 kg)  BMI 27.38 kg/m2  SpO2 99% Physical Exam  Nursing note and vitals reviewed. Constitutional: He is oriented to person, place, and time. He appears well-developed.  HENT:  Head: Normocephalic and atraumatic.  Eyes: Conjunctivae and EOM are normal. Pupils are equal, round, and reactive to light.  Neck: Normal range of motion. Neck supple.  Cardiovascular: Normal rate and regular rhythm.   Pulmonary/Chest: Effort normal and breath sounds normal.  Abdominal: Soft. Bowel sounds are normal. He exhibits no distension. There is tenderness. There is no rebound and no guarding.  Neurological: He is alert and oriented to person, place, and time.  Skin: Skin is warm.    ED Course  Procedures (including critical care time) Labs Review Labs Reviewed  CBC WITH DIFFERENTIAL - Abnormal; Notable for the following:  WBC 11.0 (*)    All other components within normal limits  COMPREHENSIVE METABOLIC PANEL    Imaging Review Ct Abdomen Pelvis W Contrast  02/12/2014   CLINICAL DATA:  Intermittent abdominal pain.  EXAM: CT ABDOMEN AND PELVIS WITH CONTRAST  TECHNIQUE: Multidetector CT imaging of the abdomen and pelvis was performed using the standard protocol following bolus administration of intravenous contrast.  CONTRAST:  OMNIPAQUE IOHEXOL 300 MG/ML  SOLN   COMPARISON:  Acute abdominal series April 03, 2011  FINDINGS: LUNG BASES: Included view of the lung bases are clear. Visualized heart and pericardium are unremarkable.  SOLID ORGANS: The liver, spleen, gallbladder, pancreas and adrenal glands are unremarkable.  GASTROINTESTINAL TRACT: The stomach, small and large bowel are normal in course and caliber without inflammatory changes. Normal appendix.  KIDNEYS/ URINARY TRACT: Kidneys are orthotopic, demonstrating symmetric enhancement. No nephrolithiasis, hydronephrosis or renal masses. The unopacified ureters are normal in course and caliber. Delayed Urinary bladder is partially distended and unremarkable.  PERITONEUM/RETROPERITONEUM: No intraperitoneal free fluid nor free air. Aortoiliac vessels are normal in course and caliber. No lymphadenopathy by CT size criteria. Internal reproductive organs are unremarkable. Phleboliths in the pelvis.  SOFT TISSUE/OSSEOUS STRUCTURES: Nonsuspicious.  IMPRESSION: No acute intra-abdominal or pelvic process.   Electronically Signed   By: Awilda Metro   On: 02/12/2014 02:00     EKG Interpretation None      MDM   Final diagnoses:  Generalized abdominal pain    Pt comes in with cc of abd pain.  His pain has been intermittent for the past few months, worse more recently. He has some diarrhea with his pain - and MAYBE bloody stool x 1.  Pt has some generalized tenderness. No hx of IBD for him or family, and no other concerning constitutionals on ROS. No fever.  We will give him some pain meds and [facility] info and return precautions have been discussed.     Derwood Kaplan, MD 02/12/14 [phone]

## 2014-02-11 NOTE — ED Notes (Signed)
Presents with 6 months of intermittent abdominal pain worse in left lower quadrant and right upper quadrant associated with one bout of bloody diarrhea and nausea. Diarrhea occurs 6-7 times a day when stomach is upset, only one episode of blood. The past 4 days abdominal pain has returned and is described as soreness with mucous in stools. Endorses lack of appetite. Pt is tender in left lower quadrant. Bowel sounds present.

## 2014-02-12 ENCOUNTER — Encounter (HOSPITAL_COMMUNITY): Payer: Self-pay | Admitting: Radiology

## 2014-02-12 ENCOUNTER — Emergency Department (HOSPITAL_COMMUNITY): Payer: Self-pay

## 2014-02-12 MED ORDER — IOHEXOL 300 MG/ML  SOLN
100.0000 mL | Freq: Once | INTRAMUSCULAR | Status: AC | PRN
  Administered 2014-02-12: 100 mL via INTRAVENOUS

## 2014-02-12 MED ORDER — IBUPROFEN 600 MG PO TABS: 600.0000 mg | ORAL_TABLET | Freq: Four times a day (QID) | ORAL | Status: DC | PRN

## 2014-02-12 NOTE — Discharge Instructions (Signed)
We saw you in the ER for the abdominal pain. All of our results are normal, including all labs and imaging. Kidney function is fine as well. We are not sure what is causing your abdominal pain, and recommend that you see your primary care doctor within 2-3 days for further evaluation. If your symptoms get worse, return to the ER. Take the pain meds and nausea meds as prescribed.  RESOURCE GUIDE  Chronic Pain Problems: Contact [facility]  [phone] Patients need to be referred by their primary care doctor.  Insufficient Money for Medicine: Contact [employer]:  call "211."   No Primary Care Doctor: - Call Health Connect  [phone] - can help you locate a primary care doctor that  accepts your insurance, provides certain services, etc. - Physician Referral Service- [phone]  Agencies that provide inexpensive medical care: - [facility]  [facility]  [phone] - [facility]  [phone] - [facility]  [phone] - Planned Parenthood  [facility]  [phone]  [place] Providers: - [facility]- [address], [address]  [phone], Mon-Fri 9am-7pm, Sat 9am-1pm - [facility]- [place], [address] [place]  [place], [address] [place]  [place]- [address]  [place], [address], [phone]  Only accepts [place] patients after they have their name  applied to their card  Self Pay (no insurance) in [place]: - Sickle Cell Patients: Dr Kevan Ny, [facility]  [place], [facility]- [place]  [facility]- [phone] [place], [facility]- see information  above (Speak to [employer] if you do not have insurance)       -  [facility]- [place],  [place], [place]  Dr Vista Lawman-  [address], [place], [place], [place]       -  Urgent Medical and [facility] - [address] [phone]       -  [facility]- [address], [place], also [address], [phone]       -    [facility]- [place], [place], 1st & 3rd Saturday        every month, 10am-1pm  [facility] [place], [place] [address] [phone]  [facility] [place]. [place], [place] [phone]  1) Find a Doctor and Pay Out of Pocket Although you won't have to find out who is covered by your insurance plan, it is a good idea to ask around and get recommendations. You will then need to call the office and see if the doctor you have chosen will accept you as a new patient and what types of options they offer for patients who are self-pay. Some doctors offer discounts or will set up payment plans for their patients who do not have insurance, but you  will need to ask so you aren't surprised when you get to your appointment.  2) Contact Your Local Health Department Not all health departments have doctors that can see patients for sick visits, but many do, so it is worth a call to see if yours does. If you don't know where your local health department is, you can check in your phone book. The CDC also has a tool to help you locate your state's health department, and many state websites also have listings of all of their local health departments.  3) Find a Walk-in Clinic If your illness is not likely to be very severe or complicated, you may want to try a walk in clinic. These are popping up all over the country in pharmacies, drugstores, and shopping centers. They're usually staffed by  nurse practitioners or physician assistants that have been trained to treat common illnesses and complaints. They're usually fairly quick and inexpensive. However, if you have serious medical issues or chronic medical problems, these are probably not your best option  STD Testing - [facility] Department of [facility], STD Clinic, [address], [place], phone [phone] or [phone].  Monday - Friday, call for an appointment. [facility] Department of [employer], STD Clinic, [place] [address], [place], phone [phone] or [phone].  Monday - Friday, call for an appointment.  Abuse/Neglect: [facility] Child Abuse Hotline [phone] [facility] Child Abuse Hotline (434)241-5361 (After Hours)  Emergency Shelter:  [facility] 212-866-5995  Maternity Homes: - [facility] [place] [facility] [phone] - [facility] [phone]  MRSA Hotline #:   501-046-9272  Dental Assistance If unable to pay or uninsured, contact:  [facility]. to become qualified for the adult dental clinic.  Patients with Medicaid: [facility] [phone] [address], [phone] [address] [phone]  If unable to pay, or uninsured, contact [facility] [phone] in [place], [phone] in [facility]) to become qualified for the adult dental clinic  [facility] [address] [place], [place] [address] [phone] [email or web address]  Other Proofreader Services: - [facility]- [address] [place], [place], [place], [address], [phone], Ext. [phone], 2nd and 4th Thursday of the month at 6:30am.  10 clients each day by appointment, can sometimes see walk-in patients if someone does not show for an appointment. [facility]- [address] [place], [place], [address], [phone] - [facility]- [address], [place], [place], [address], [phone] [facility]- [phone] [facility] Health Department- [phone] [facility] [phone]           Abdominal Pain Many things can cause abdominal pain. Usually, abdominal pain is not caused by a disease and will improve without treatment. It can often be observed and treated at home. Your health care provider will do a physical exam and possibly order blood tests and X-rays to help determine the seriousness of your pain. However, in many cases, more time must pass before a clear cause of the pain can be found. Before that point, your health care provider may not know if you need more testing or further treatment. HOME CARE INSTRUCTIONS  Monitor your abdominal pain for any changes. The following actions may help to alleviate any discomfort you are experiencing:  Only take over-the-counter or prescription medicines as directed by your  health care provider.  Do not take laxatives unless directed to do so by your health care provider.  Try a clear liquid diet (broth, tea, or water) as directed by your health care provider. Slowly move to a bland diet as tolerated. SEEK MEDICAL CARE IF:  You have unexplained abdominal pain.  You have abdominal pain associated with nausea or diarrhea.  You have pain when you urinate or have a bowel movement.  You experience abdominal pain that wakes you in the night.  You have abdominal pain that is worsened or improved by eating food.  You have abdominal pain that is worsened with eating fatty foods.  You have a fever. SEEK IMMEDIATE MEDICAL CARE IF:   Your pain does not go away within 2 hours.  You keep throwing up (vomiting).  Your pain is felt only in portions of the abdomen, such as the right side or the left lower portion of the abdomen.  You pass bloody or black tarry stools. MAKE SURE YOU:  Understand these instructions.   Will watch  your condition.   Will get help right away if you are not doing well or get worse.  Document Released: 04/15/2005 Document Revised: 07/11/2013 Document Reviewed: 03/15/2013 [facility] Patient Information 2015 [place], [place]. This information is not intended to replace advice given to you by your health care provider. Make sure you discuss any questions you have with your health care provider.

## 2014-02-12 NOTE — ED Notes (Signed)
Pt A&OX4, ambulatory at d/c with steady gait, NAD 

## 2014-02-12 NOTE — ED Notes (Signed)
Patient returned from CT

## 2014-02-12 NOTE — ED Notes (Signed)
Patient transported to CT 

## 2014-03-05 ENCOUNTER — Encounter: Payer: Self-pay | Admitting: Internal Medicine

## 2014-03-05 ENCOUNTER — Ambulatory Visit: Payer: Self-pay | Attending: Internal Medicine | Admitting: Internal Medicine

## 2014-03-05 VITALS — BP 135/71 | HR 70 | Temp 98.1°F | Resp 18 | Ht 68.0 in | Wt 181.1 lb

## 2014-03-05 DIAGNOSIS — Z79899 Other long term (current) drug therapy: Secondary | ICD-10-CM | POA: Insufficient documentation

## 2014-03-05 DIAGNOSIS — F17200 Nicotine dependence, unspecified, uncomplicated: Secondary | ICD-10-CM | POA: Insufficient documentation

## 2014-03-05 DIAGNOSIS — R1084 Generalized abdominal pain: Secondary | ICD-10-CM | POA: Insufficient documentation

## 2014-03-05 DIAGNOSIS — R109 Unspecified abdominal pain: Secondary | ICD-10-CM

## 2014-03-05 DIAGNOSIS — K219 Gastro-esophageal reflux disease without esophagitis: Secondary | ICD-10-CM | POA: Insufficient documentation

## 2014-03-05 MED ORDER — PANTOPRAZOLE SODIUM 40 MG PO TBEC: 40.0000 mg | DELAYED_RELEASE_TABLET | Freq: Every day | ORAL | Status: DC

## 2014-03-05 NOTE — Progress Notes (Signed)
Establish care C/C Stomach pain for about 8 month nausea no vomiting, on and off diarrhea Stated he was at the ER two weeks ago no Dx given, need referral for GI

## 2014-03-05 NOTE — Progress Notes (Signed)
Patient ID: Paul Newton, male   DOB: August 27, 1980, 33 y.o.   MRN: [id number]  CC:  Abdominal pain  HPI:  Paul Newton is a 33 y.o. male that presents to clinic today with a past medical history of pneumonia. Patient reports that he has been having generalizeded abdominal pain for the past 8 months. He states that he feels nauseous, burning in stomach, and diarrhea.  He reports that the pain is progressing to daily and having loose stools.  The pain is mainly epigastric and RLQ.  He reports that he occasionally gets acid reflux.  He states that the pain is worst with spicy foods, tomato paste, and pizza.  He reports that he has tried OTC acid reflux medication with little relief.  He also c/o of blood in his stools twice, last time over one month ago.    No Known Allergies Past Medical History  Diagnosis Date  . Pneumonia    Current Outpatient Prescriptions on File Prior to Visit  Medication Sig Dispense Refill  . Probiotic Product (PROBIOTIC DAILY PO) Take 1 tablet by mouth daily.      Marland Kitchen albuterol (PROVENTIL HFA;VENTOLIN HFA) 108 (90 BASE) MCG/ACT inhaler Inhale 2 puffs into the lungs every 6 (six) hours as needed for wheezing.  1 Inhaler  2  . ibuprofen (ADVIL,MOTRIN) 600 MG tablet Take 1 tablet (600 mg total) by mouth every 6 (six) hours as needed.  30 tablet  0  . [DISCONTINUED] dicyclomine (BENTYL) 20 MG tablet Take 1 tablet (20 mg total) by mouth 2 (two) times daily.  50 tablet  0  . [DISCONTINUED] pantoprazole (PROTONIX) 40 MG tablet Take 1 tablet (40 mg total) by mouth daily.  30 tablet  0   No current facility-administered medications on file prior to visit.   Family History  Problem Relation Age of Onset  . Arthritis Mother    History   Social History  . Marital Status: Single    Spouse Name: N/A    Number of Children: N/A  . Years of Education: N/A   Occupational History  . Not on file.   Social History Main Topics  . Smoking status: Current Every Day Smoker --  1.00 packs/day    Types: Cigarettes  . Smokeless tobacco: Not on file  . Alcohol Use: Yes     Comment: occasional  . Drug Use: No  . Sexual Activity: Not on file   Other Topics Concern  . Not on file   Social History Narrative  . No narrative on file    Review of Systems: Constitutional: Negative for fever, chills, diaphoresis, activity change, appetite change and fatigue. HENT: Negative for ear pain, nosebleeds, congestion, facial swelling, rhinorrhea, neck pain, neck stiffness and ear discharge.  Eyes: Negative for pain, discharge, redness, itching and visual disturbance. Respiratory: Negative for cough, choking, chest tightness, shortness of breath, wheezing and stridor.  Cardiovascular: Negative for chest pain, palpitations and leg swelling. Gastrointestinal: Negative for abdominal distention. + abdominal pain, nausea, diarrhea Genitourinary: Negative for dysuria, urgency, frequency, hematuria, flank pain, decreased urine volume, difficulty urinating and dyspareunia.  Musculoskeletal: Negative for back pain, joint swelling, arthralgias and gait problem. Neurological: Negative for dizziness, tremors, seizures, syncope, facial asymmetry, speech difficulty, weakness, light-headedness, numbness and headaches.  Hematological: Negative for adenopathy. Does not bruise/bleed easily. Psychiatric/Behavioral: Negative for hallucinations, behavioral problems, confusion, dysphoric mood, decreased concentration and agitation.    Objective:   Filed Vitals:   03/05/14 1501  BP: 135/71  Pulse: 70  Temp: 98.1 F (36.7 C)  Resp: 18    Physical Exam: Constitutional: Patient appears well-developed and well-nourished. No distress. HENT: Normocephalic, atraumatic, External right and left ear normal. Oropharynx is clear and moist.  Eyes: Conjunctivae and EOM are normal. PERRLA, no scleral icterus. Neck: Normal ROM. Neck supple. No JVD. No tracheal deviation. No thyromegaly. CVS: RRR, S1/S2  +, no murmurs, no gallops, no carotid bruit.  Pulmonary: Effort and breath sounds normal, no stridor, rhonchi, wheezes, rales.  Abdominal: Soft. BS +,  no distension, tenderness, rebound or guarding.  Musculoskeletal: Normal range of motion. No edema and no tenderness.  Lymphadenopathy: No lymphadenopathy noted, cervicay Neuro: Alert. Normal reflexes, muscle tone coordination. No cranial nerve deficit. Skin: Skin is warm and dry. No rash noted. Not diaphoretic. No erythema. No pallor. Psychiatric: Normal mood and affect. Behavior, judgment, thought content normal.  Lab Results  Component Value Date   WBC 11.0* 02/11/2014   HGB 15.5 02/11/2014   HCT 44.7 02/11/2014   MCV 92.2 02/11/2014   PLT 274 02/11/2014   Lab Results  Component Value Date   CREATININE 1.03 02/11/2014   BUN 12 02/11/2014   NA 139 02/11/2014   K 3.8 02/11/2014   CL 100 02/11/2014   CO2 24 02/11/2014    No results found for this basename: HGBA1C   Lipid Panel  No results found for this basename: chol, trig, hdl, cholhdl, vldl, ldlcalc       Assessment and plan:   Arlys JohnBrian was seen today for gi problem and establish care.  Diagnoses and associated orders for this visit:  Abdominal pain, unspecified site  Gastroesophageal reflux disease, esophagitis presence not specified - pantoprazole (PROTONIX) 40 MG tablet; Take 1 tablet (40 mg total) by mouth daily.    Return if symptoms worsen or fail to improve.       Holland CommonsKECK, VALERIE, NP-C [facility] [phone] 03/05/2014, 3:11 PM

## 2014-03-05 NOTE — Patient Instructions (Signed)
Gastroesophageal Reflux Disease, Adult Gastroesophageal reflux disease (GERD) happens when acid from your stomach flows up into the esophagus. When acid comes in contact with the esophagus, the acid causes soreness (inflammation) in the esophagus. Over time, GERD may create small holes (ulcers) in the lining of the esophagus. CAUSES   Increased body weight. This puts pressure on the stomach, making acid rise from the stomach into the esophagus.  Smoking. This increases acid production in the stomach.  Drinking alcohol. This causes decreased pressure in the lower esophageal sphincter (valve or ring of muscle between the esophagus and stomach), allowing acid from the stomach into the esophagus.  Late evening meals and a full stomach. This increases pressure and acid production in the stomach.  A malformed lower esophageal sphincter. Sometimes, no cause is found. SYMPTOMS   Burning pain in the lower part of the mid-chest behind the breastbone and in the mid-stomach area. This may occur twice a week or more often.  Trouble swallowing.  Sore throat.  Dry cough.  Asthma-like symptoms including chest tightness, shortness of breath, or wheezing. DIAGNOSIS  Your caregiver may be able to diagnose GERD based on your symptoms. In some cases, X-rays and other tests may be done to check for complications or to check the condition of your stomach and esophagus. TREATMENT  Your caregiver may recommend over-the-counter or prescription medicines to help decrease acid production. Ask your caregiver before starting or adding any new medicines.  HOME CARE INSTRUCTIONS   Change the factors that you can control. Ask your caregiver for guidance concerning weight loss, quitting smoking, and alcohol consumption.  Avoid foods and drinks that make your symptoms worse, such as:  Caffeine or alcoholic drinks.  Chocolate.  Peppermint or mint flavorings.  Garlic and onions.  Spicy foods.  Citrus fruits,  such as oranges, lemons, or limes.  Tomato-based foods such as sauce, chili, salsa, and pizza.  Fried and fatty foods.  Avoid lying down for the 3 hours prior to your bedtime or prior to taking a nap.  Eat small, frequent meals instead of large meals.  Wear loose-fitting clothing. Do not wear anything tight around your waist that causes pressure on your stomach.  Raise the head of your bed 6 to 8 inches with wood blocks to help you sleep. Extra pillows will not help.  Only take over-the-counter or prescription medicines for pain, discomfort, or fever as directed by your caregiver.  Do not take aspirin, ibuprofen, or other nonsteroidal anti-inflammatory drugs (NSAIDs). SEEK IMMEDIATE MEDICAL CARE IF:   You have pain in your arms, neck, jaw, teeth, or back.  Your pain increases or changes in intensity or duration.  You develop nausea, vomiting, or sweating (diaphoresis).  You develop shortness of breath, or you faint.  Your vomit is green, yellow, black, or looks like coffee grounds or blood.  Your stool is red, bloody, or black. These symptoms could be signs of other problems, such as heart disease, gastric bleeding, or esophageal bleeding. MAKE SURE YOU:   Understand these instructions.  Will watch your condition.  Will get help right away if you are not doing well or get worse. Document Released: 04/15/2005 Document Revised: 09/28/2011 Document Reviewed: 01/23/2011 ExitCare Patient Information 2015 Exit[employer], [employer]. This information is not intended to replace advice given to you by your health care provider. Make sure you discuss any questions you have with your health care provider.  

## 2014-03-14 ENCOUNTER — Telehealth: Payer: Self-pay | Admitting: *Deleted

## 2014-03-14 ENCOUNTER — Telehealth: Payer: Self-pay | Admitting: Internal Medicine

## 2014-09-11 NOTE — Telephone Encounter (Signed)
Open in error

## 2014-09-16 ENCOUNTER — Emergency Department: Payer: Self-pay | Admitting: Emergency Medicine

## 2015-03-27 ENCOUNTER — Encounter (HOSPITAL_COMMUNITY): Payer: Self-pay | Admitting: *Deleted

## 2015-03-27 ENCOUNTER — Emergency Department (HOSPITAL_COMMUNITY)
Admission: EM | Admit: 2015-03-27 | Discharge: 2015-03-27 | Disposition: A | Discharge status: Home / Self Care | Payer: 59 | Attending: Emergency Medicine | Admitting: Emergency Medicine

## 2015-03-27 DIAGNOSIS — R51 Headache: Secondary | ICD-10-CM | POA: Diagnosis not present

## 2015-03-27 DIAGNOSIS — R0981 Nasal congestion: Secondary | ICD-10-CM | POA: Diagnosis not present

## 2015-03-27 DIAGNOSIS — Z8701 Personal history of pneumonia (recurrent): Secondary | ICD-10-CM | POA: Insufficient documentation

## 2015-03-27 DIAGNOSIS — R05 Cough: Secondary | ICD-10-CM | POA: Insufficient documentation

## 2015-03-27 DIAGNOSIS — R109 Unspecified abdominal pain: Secondary | ICD-10-CM | POA: Insufficient documentation

## 2015-03-27 DIAGNOSIS — R350 Frequency of micturition: Secondary | ICD-10-CM | POA: Diagnosis not present

## 2015-03-27 DIAGNOSIS — R300 Dysuria: Secondary | ICD-10-CM | POA: Diagnosis not present

## 2015-03-27 DIAGNOSIS — R110 Nausea: Secondary | ICD-10-CM | POA: Insufficient documentation

## 2015-03-27 DIAGNOSIS — R197 Diarrhea, unspecified: Secondary | ICD-10-CM | POA: Insufficient documentation

## 2015-03-27 DIAGNOSIS — N508 Other specified disorders of male genital organs: Secondary | ICD-10-CM | POA: Diagnosis present

## 2015-03-27 DIAGNOSIS — J3489 Other specified disorders of nose and nasal sinuses: Secondary | ICD-10-CM | POA: Insufficient documentation

## 2015-03-27 DIAGNOSIS — Z720 Tobacco use: Secondary | ICD-10-CM | POA: Insufficient documentation

## 2015-03-27 DIAGNOSIS — N451 Epididymitis: Secondary | ICD-10-CM | POA: Diagnosis not present

## 2015-03-27 DIAGNOSIS — R6889 Other general symptoms and signs: Secondary | ICD-10-CM

## 2015-03-27 DIAGNOSIS — R509 Fever, unspecified: Secondary | ICD-10-CM | POA: Diagnosis not present

## 2015-03-27 DIAGNOSIS — M791 Myalgia: Secondary | ICD-10-CM | POA: Insufficient documentation

## 2015-03-27 LAB — CBC
HCT: 46 % (ref 39.0–52.0)
Hemoglobin: 16 g/dL (ref 13.0–17.0)
MCH: 32.5 pg (ref 26.0–34.0)
MCHC: 34.8 g/dL (ref 30.0–36.0)
MCV: 93.3 fL (ref 78.0–100.0)
Platelets: 258 10*3/uL (ref 150–400)
RBC: 4.93 MIL/uL (ref 4.22–5.81)
RDW: 13.1 % (ref 11.5–15.5)
WBC: 11 10*3/uL — ABNORMAL HIGH (ref 4.0–10.5)

## 2015-03-27 LAB — URINE MICROSCOPIC-ADD ON

## 2015-03-27 LAB — URINALYSIS, ROUTINE W REFLEX MICROSCOPIC
Bilirubin Urine: NEGATIVE
Glucose, UA: NEGATIVE mg/dL
Ketones, ur: NEGATIVE mg/dL
LEUKOCYTES UA: NEGATIVE
NITRITE: NEGATIVE
PH: 6 (ref 5.0–8.0)
Protein, ur: NEGATIVE mg/dL
SPECIFIC GRAVITY, URINE: 1.026 (ref 1.005–1.030)
UROBILINOGEN UA: 1 mg/dL (ref 0.0–1.0)

## 2015-03-27 LAB — COMPREHENSIVE METABOLIC PANEL
ALK PHOS: 65 U/L (ref 38–126)
ALT: 30 U/L (ref 17–63)
AST: 26 U/L (ref 15–41)
Albumin: 4.3 g/dL (ref 3.5–5.0)
Anion gap: 8 (ref 5–15)
BUN: 10 mg/dL (ref 6–20)
CALCIUM: 9.6 mg/dL (ref 8.9–10.3)
CO2: 27 mmol/L (ref 22–32)
CREATININE: 1.29 mg/dL — AB (ref 0.61–1.24)
Chloride: 102 mmol/L (ref 101–111)
GFR calc non Af Amer: >60 mL/min (ref >60)
GLUCOSE: 90 mg/dL (ref 65–99)
Potassium: 4.3 mmol/L (ref 3.5–5.1)
SODIUM: 137 mmol/L (ref 135–145)
Total Bilirubin: 0.7 mg/dL (ref 0.3–1.2)
Total Protein: 7.8 g/dL (ref 6.5–8.1)

## 2015-03-27 LAB — LIPASE, BLOOD: Lipase: 21 U/L — ABNORMAL LOW (ref 22–51)

## 2015-03-27 MED ORDER — IBUPROFEN 600 MG PO TABS: 600.0000 mg | ORAL_TABLET | Freq: Four times a day (QID) | ORAL | Status: DC | PRN

## 2015-03-27 MED ORDER — ACETAMINOPHEN 325 MG PO TABS: 650.0000 mg | ORAL_TABLET | Freq: Four times a day (QID) | ORAL | Status: DC | PRN

## 2015-03-27 MED ORDER — LEVOFLOXACIN 250 MG PO TABS: 500.0000 mg | ORAL_TABLET | Freq: Every day | ORAL | Status: AC

## 2015-03-27 NOTE — ED Notes (Signed)
Pt. Left with all belongings and refused wheelchair. Discharge instructions were reviewed and all questions were answered.  

## 2015-03-27 NOTE — ED Provider Notes (Signed)
CSN: [id number]     Arrival date & time 03/27/15  1656 History   First MD Initiated Contact with Patient 03/27/15 2021     Chief Complaint  Patient presents with  . Abdominal Pain  . Testicle Pain   Patient is a 34 y.o. male presenting with general illness. The history is provided by the patient. No language interpreter was used.  Illness Location:  Generalized Quality:  Flu-like symptoms & testicular pain Severity:  Moderate Onset quality:  Gradual Timing:  Constant Progression:  Worsening Chronicity:  Recurrent Context:  Flulike symptoms and bilateral testicular pain. Symptoms started 3-4 days ago. Symptoms include subjective fever, chills, headache, body aches, nausea, diarrhea, nasal congestion, diffuse abdominal pain, dysuria, urinary frequency, and testicular pain. Patient seen 3 years ago for similar testicular pain and absence of aforementioned symptoms at which time he was referred to urology and diagnosed with cyst. Patient denies hematuria, penile discharge, hematochezia, melena, hemoptysis, and decreased UOP Associated symptoms: abdominal pain, congestion, cough, diarrhea, fever, headaches, myalgias, nausea and rhinorrhea   Associated symptoms: no chest pain and no vomiting     Past Medical History  Diagnosis Date  . Pneumonia    Past Surgical History  Procedure Laterality Date  . Lymphadenectomy      Right neck R/T swelling with airway compromise during bout with pneumonia   Family History  Problem Relation Age of Onset  . Arthritis Mother    Social History  Substance Use Topics  . Smoking status: Current Every Day Smoker -- 1.00 packs/day    Types: Cigarettes  . Smokeless tobacco: None  . Alcohol Use: Yes     Comment: occasional    Review of Systems  Constitutional: Positive for fever and chills.  HENT: Positive for congestion and rhinorrhea.   Respiratory: Positive for cough.   Cardiovascular: Negative for chest pain.  Gastrointestinal: Positive for  nausea, abdominal pain and diarrhea. Negative for vomiting.  Genitourinary: Positive for dysuria, frequency and testicular pain. Negative for urgency, hematuria, discharge, penile swelling, scrotal swelling and penile pain.  Musculoskeletal: Positive for myalgias.  Neurological: Positive for headaches.  All other systems reviewed and are negative.   Allergies  Review of patient's allergies indicates no known allergies.  Home Medications   Prior to Admission medications   Medication Sig Start Date End Date Taking? Authorizing Provider  albuterol (PROVENTIL HFA;VENTOLIN HFA) 108 (90 BASE) MCG/ACT inhaler Inhale 2 puffs into the lungs every 6 (six) hours as needed for wheezing. 07/06/12  Yes Clanford Cyndie Mull, MD  acetaminophen (TYLENOL) 325 MG tablet Take 2 tablets (650 mg total) by mouth every 6 (six) hours as needed. 03/27/15   Angelina Ok, MD  ibuprofen (ADVIL,MOTRIN) 600 MG tablet Take 1 tablet (600 mg total) by mouth every 6 (six) hours as needed. 03/27/15   Angelina Ok, MD  levofloxacin (LEVAQUIN) 250 MG tablet Take 2 tablets (500 mg total) by mouth daily. 03/27/15 04/06/15  Angelina Ok, MD   BP 114/63 mmHg  Pulse 48  Temp(Src) 98.1 F (36.7 C) (Oral)  Resp 18  SpO2 97%   Physical Exam  Constitutional: He is oriented to person, place, and time. He appears well-developed and well-nourished. No distress.  HENT:  Head: Normocephalic and atraumatic.  Eyes: Conjunctivae are normal. Pupils are equal, round, and reactive to light.  Neck: Normal range of motion. Neck supple.  Cardiovascular: Normal rate, regular rhythm and intact distal pulses.   Pulmonary/Chest: Effort normal and breath sounds normal.  Abdominal: Soft. Bowel sounds  are normal.  Genitourinary: Rectum normal, prostate normal and penis normal. No penile tenderness.  Testicular exam showing mild tenderness to palpation of epididymis bilaterally without obvious swelling, erythema, induration, or fluctuance. Reflex  normal bilaterally.  Musculoskeletal: Normal range of motion.  Neurological: He is alert and oriented to person, place, and time.  Skin: Skin is warm and dry. He is not diaphoretic.  Nursing note and vitals reviewed.   ED Course  Procedures   Labs Review Labs Reviewed  LIPASE, BLOOD - Abnormal; Notable for the following:    Lipase 21 (*)    All other components within normal limits  COMPREHENSIVE METABOLIC PANEL - Abnormal; Notable for the following:    Creatinine, Ser 1.29 (*)    All other components within normal limits  CBC - Abnormal; Notable for the following:    WBC 11.0 (*)    All other components within normal limits  URINALYSIS, ROUTINE W REFLEX MICROSCOPIC (NOT AT [facility]) - Abnormal; Notable for the following:    Hgb urine dipstick MODERATE (*)    All other components within normal limits  URINE MICROSCOPIC-ADD ON   Imaging Review No results found. I have personally reviewed and evaluated these images and lab results as part of my medical decision-making.   EKG Interpretation None      MDM  Mr. Howk is a 34 yo male presenting with flulike symptoms and bilateral testicular pain. Symptoms started 3-4 days ago. Symptoms include subjective fever, chills, headache, body aches, nausea, diarrhea, nasal congestion, diffuse abdominal pain, dysuria, urinary frequency, and testicular pain. Patient seen 3 years ago for similar testicular pain and absence of aforementioned symptoms at which time he was referred to urology and diagnosed with cyst. Patient denies hematuria, penile discharge, hematochezia, melena, hemoptysis, and decreased urine output.  Exam above notable for young male on a stretcher in no acute distress. Afebrile. Not tachycardic. Normotensive. Abdominal exam benign with no tenderness to palpation. Testicular exam showing mild tenderness to palpation of her epididymis bilaterally without obvious swelling, erythema, induration, or fluctuance. Reflex normal  bilaterally.  Patient comfortable appearing and testicular exam not consistent with torsion. CBC unremarkable. Lipase 21. CMP relatively unremarkable aside from creatinine of 1.29 which is consistent with patient's decreased by mouth intake and watery diarrhea. UA negative for infection or severe dehydration (specific gravity 1.026). Microscopic urine showing rare bacteria and rare epithelial cells.  Patient's presentation consistent with flulike illness as well has associated epididymitis. Patient discharged home in stable condition with a prescription for levofloxacin. Patient will follow-up with his PCP to obtain urology consult for further evaluation. Strict ED return consciousness. Patient stands and agrees with plan and has no further questions or concerns time.  Final diagnoses:  Epididymitis  Flu-like symptoms    Angelina Ok, MD 03/27/15 2354  Benjiman Core, MD 03/28/15 2130

## 2015-03-27 NOTE — Discharge Instructions (Signed)
Epididymitis °Epididymitis is a swelling (inflammation) of the epididymis. The epididymis is a cord-like structure along the back part of the testicle. Epididymitis is usually, but not always, caused by infection. This is usually a sudden problem beginning with chills, fever and pain behind the scrotum and in the testicle. There may be swelling and redness of the testicle. °DIAGNOSIS  °Physical examination will reveal a tender, swollen epididymis. Sometimes, cultures are obtained from the urine or from prostate secretions to help find out if there is an infection or if the cause is a different problem. Sometimes, blood work is performed to see if your white blood cell count is elevated and if a germ (bacterial) or viral infection is present. Using this knowledge, an appropriate medicine which kills germs (antibiotic) can be chosen by your caregiver. A viral infection causing epididymitis will most often go away (resolve) without treatment. °HOME CARE INSTRUCTIONS  °· Hot sitz baths for 20 minutes, 4 times per day, may help relieve pain. °· Only take over-the-counter or prescription medicines for pain, discomfort or fever as directed by your caregiver. °· Take all medicines, including antibiotics, as directed. Take the antibiotics for the full prescribed length of time even if you are feeling better. °· It is very important to keep all follow-up appointments. °SEEK IMMEDIATE MEDICAL CARE IF:  °· You have a fever. °· You have pain not relieved with medicines. °· You have any worsening of your problems. °· Your pain seems to come and go. °· You develop pain, redness, and swelling in the scrotum and surrounding areas. °MAKE SURE YOU:  °· Understand these instructions. °· Will watch your condition. °· Will get help right away if you are not doing well or get worse. °Document Released: 07/03/2000 Document Revised: 09/28/2011 Document Reviewed: 05/23/2009 °[employer]® Patient Information ©2015 [employer]. This information  is not intended to replace advice given to you by your health care provider. Make sure you discuss any questions you have with your health care provider. ° °

## 2015-03-27 NOTE — ED Notes (Signed)
Pt reports abdominal pain, lower rt back pain and rt testicle pain. Pt reports nausea as well. Denies any urinary symptoms.

## 2015-09-02 DIAGNOSIS — H6982 Other specified disorders of Eustachian tube, left ear: Secondary | ICD-10-CM | POA: Insufficient documentation

## 2015-09-02 DIAGNOSIS — H6992 Unspecified Eustachian tube disorder, left ear: Secondary | ICD-10-CM | POA: Insufficient documentation

## 2017-05-27 DIAGNOSIS — J019 Acute sinusitis, unspecified: Secondary | ICD-10-CM | POA: Insufficient documentation

## 2018-03-01 DIAGNOSIS — M26622 Arthralgia of left temporomandibular joint: Secondary | ICD-10-CM | POA: Insufficient documentation

## 2018-07-20 DIAGNOSIS — U071 COVID-19: Secondary | ICD-10-CM

## 2018-07-20 HISTORY — DX: COVID-19: U07.1

## 2018-08-06 NOTE — Progress Notes (Deleted)
   Subjective:    Patient ID: Paul Newton, male    DOB: March 09, 1981, 38 y.o.   MRN: [id number]  HPI:  [name] is here establish as a new pt.  He is a pleasant 38 year old male.  PMH:  Patient Care Team    Relationship Specialty Notifications Start End  Kaleen Mask, MD PCP - General Family Medicine  03/27/15     There are no active problems to display for this patient.    Past Medical History:  Diagnosis Date  . Pneumonia      Past Surgical History:  Procedure Laterality Date  . LYMPHADENECTOMY     Right neck R/T swelling with airway compromise during bout with pneumonia     Family History  Problem Relation Age of Onset  . Arthritis Mother      Social History   Substance and Sexual Activity  Drug Use No     Social History   Substance and Sexual Activity  Alcohol Use Yes   Comment: occasional     Social History   Tobacco Use  Smoking Status Current Every Day Smoker  . Packs/day: 1.00  . Types: Cigarettes     Outpatient Encounter Medications as of 08/08/2018  Medication Sig  . acetaminophen (TYLENOL) 325 MG tablet Take 2 tablets (650 mg total) by mouth every 6 (six) hours as needed.  Marland Kitchen albuterol (PROVENTIL HFA;VENTOLIN HFA) 108 (90 BASE) MCG/ACT inhaler Inhale 2 puffs into the lungs every 6 (six) hours as needed for wheezing.  Marland Kitchen ibuprofen (ADVIL,MOTRIN) 600 MG tablet Take 1 tablet (600 mg total) by mouth every 6 (six) hours as needed.  . [DISCONTINUED] dicyclomine (BENTYL) 20 MG tablet Take 1 tablet (20 mg total) by mouth 2 (two) times daily.   No facility-administered encounter medications on file as of 08/08/2018.     Allergies: Patient has no known allergies.  There is no height or weight on file to calculate BMI.  There were no vitals taken for this visit.     Review of Systems     Objective:   Physical Exam        Assessment & Plan:  No diagnosis found.  No problem-specific Assessment & Plan notes found for  this encounter.    FOLLOW-UP:  No follow-ups on file.

## 2018-08-08 ENCOUNTER — Ambulatory Visit: Payer: Self-pay | Admitting: Adult Health

## 2018-08-24 NOTE — Progress Notes (Deleted)
   Subjective:    Patient ID: Paul Newton, male    DOB: [birth year], 38 y.o.   MRN: [id number]  HPI:  Paul Newton is here to establish as a new pt.  He is a pleasant 38 year old male. PMH:  Patient Care Team    Relationship Specialty Notifications Start End  Kaleen Mask, MD PCP - General Family Medicine  03/27/15     There are no active problems to display for this patient.    Past Medical History:  Diagnosis Date  . Pneumonia      Past Surgical History:  Procedure Laterality Date  . LYMPHADENECTOMY     Right neck R/T swelling with airway compromise during bout with pneumonia     Family History  Problem Relation Age of Onset  . Arthritis Mother      Social History   Substance and Sexual Activity  Drug Use No     Social History   Substance and Sexual Activity  Alcohol Use Yes   Comment: occasional     Social History   Tobacco Use  Smoking Status Current Every Day Smoker  . Packs/day: 1.00  . Types: Cigarettes     Outpatient Encounter Medications as of 08/30/2018  Medication Sig  . acetaminophen (TYLENOL) 325 MG tablet Take 2 tablets (650 mg total) by mouth every 6 (six) hours as needed.  Marland Kitchen albuterol (PROVENTIL HFA;VENTOLIN HFA) 108 (90 BASE) MCG/ACT inhaler Inhale 2 puffs into the lungs every 6 (six) hours as needed for wheezing.  Marland Kitchen ibuprofen (ADVIL,MOTRIN) 600 MG tablet Take 1 tablet (600 mg total) by mouth every 6 (six) hours as needed.  . [DISCONTINUED] dicyclomine (BENTYL) 20 MG tablet Take 1 tablet (20 mg total) by mouth 2 (two) times daily.   No facility-administered encounter medications on file as of 08/30/2018.     Allergies: Patient has no known allergies.  There is no height or weight on file to calculate BMI.  There were no vitals taken for this visit.     Review of Systems     Objective:   Physical Exam        Assessment & Plan:  No diagnosis found.  No problem-specific Assessment & Plan notes found for  this encounter.    FOLLOW-UP:  No follow-ups on file.

## 2018-08-30 ENCOUNTER — Ambulatory Visit: Payer: Self-pay | Admitting: Adult Health

## 2019-04-24 ENCOUNTER — Other Ambulatory Visit: Payer: Self-pay

## 2019-04-24 DIAGNOSIS — Z20822 Contact with and (suspected) exposure to covid-19: Secondary | ICD-10-CM

## 2019-04-24 DIAGNOSIS — Z20828 Contact with and (suspected) exposure to other viral communicable diseases: Secondary | ICD-10-CM

## 2019-04-26 ENCOUNTER — Telehealth: Payer: Self-pay | Admitting: Family Medicine

## 2019-04-26 LAB — NOVEL CORONAVIRUS, NAA: SARS-CoV-2, NAA: NOT DETECTED

## 2019-04-26 NOTE — Telephone Encounter (Signed)
Pt aware covid lab test negative, not detected  Also was able to activate mychart

## 2020-06-13 ENCOUNTER — Encounter: Payer: Self-pay | Admitting: Emergency Medicine

## 2020-06-13 ENCOUNTER — Emergency Department
Admission: EM | Admit: 2020-06-13 | Discharge: 2020-06-13 | Disposition: A | Discharge status: Home / Self Care | Payer: BLUE CROSS/BLUE SHIELD | Attending: Emergency Medicine | Admitting: Emergency Medicine

## 2020-06-13 ENCOUNTER — Other Ambulatory Visit: Payer: Self-pay

## 2020-06-13 DIAGNOSIS — S61012A Laceration without foreign body of left thumb without damage to nail, initial encounter: Secondary | ICD-10-CM | POA: Diagnosis not present

## 2020-06-13 DIAGNOSIS — W230XXA Caught, crushed, jammed, or pinched between moving objects, initial encounter: Secondary | ICD-10-CM | POA: Insufficient documentation

## 2020-06-13 DIAGNOSIS — F17210 Nicotine dependence, cigarettes, uncomplicated: Secondary | ICD-10-CM | POA: Diagnosis not present

## 2020-06-13 MED ORDER — CEPHALEXIN 500 MG PO CAPS: 1000.0000 mg | ORAL_CAPSULE | Freq: Two times a day (BID) | ORAL | 0 refills | Status: DC
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
## 2020-06-13 NOTE — ED Triage Notes (Signed)
Pt to ED from home c/o laceration to left proximal thumb.  Bleeding controlled.
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
## 2020-07-14 ENCOUNTER — Encounter: Payer: Self-pay | Admitting: Emergency Medicine

## 2020-07-14 ENCOUNTER — Other Ambulatory Visit: Payer: Self-pay

## 2020-07-14 ENCOUNTER — Emergency Department
Admission: EM | Admit: 2020-07-14 | Discharge: 2020-07-14 | Disposition: A | Discharge status: Home / Self Care | Payer: BLUE CROSS/BLUE SHIELD | Attending: Emergency Medicine | Admitting: Emergency Medicine

## 2020-07-14 ENCOUNTER — Emergency Department: Payer: BLUE CROSS/BLUE SHIELD

## 2020-07-14 DIAGNOSIS — S61233A Puncture wound without foreign body of left middle finger without damage to nail, initial encounter: Secondary | ICD-10-CM | POA: Insufficient documentation

## 2020-07-14 DIAGNOSIS — F17210 Nicotine dependence, cigarettes, uncomplicated: Secondary | ICD-10-CM | POA: Diagnosis not present

## 2020-07-14 DIAGNOSIS — Z23 Encounter for immunization: Secondary | ICD-10-CM | POA: Diagnosis not present

## 2020-07-14 DIAGNOSIS — R200 Anesthesia of skin: Secondary | ICD-10-CM | POA: Diagnosis not present

## 2020-07-14 DIAGNOSIS — S6992XA Unspecified injury of left wrist, hand and finger(s), initial encounter: Secondary | ICD-10-CM | POA: Diagnosis present

## 2020-07-14 DIAGNOSIS — W268XXA Contact with other sharp object(s), not elsewhere classified, initial encounter: Secondary | ICD-10-CM | POA: Diagnosis not present

## 2020-07-14 DIAGNOSIS — S61239A Puncture wound without foreign body of unspecified finger without damage to nail, initial encounter: Secondary | ICD-10-CM

## 2020-07-14 MED ORDER — CEPHALEXIN 500 MG PO CAPS: 500.0000 mg | ORAL_CAPSULE | Freq: Four times a day (QID) | ORAL | 0 refills | Status: AC

## 2020-07-14 MED ORDER — TETANUS-DIPHTH-ACELL PERTUSSIS 5-2.5-18.5 LF-MCG/0.5 IM SUSY
0.5000 mL | PREFILLED_SYRINGE | Freq: Once | INTRAMUSCULAR | Status: AC
Start: 2020-07-14 — End: 2020-07-14
  Administered 2020-07-14: 0.5 mL via INTRAMUSCULAR
  Filled 2020-07-14: qty 0.5

## 2020-07-14 MED ORDER — CEPHALEXIN 500 MG PO CAPS
500.0000 mg | ORAL_CAPSULE | Freq: Once | ORAL | Status: AC
  Administered 2020-07-14: 500 mg via ORAL
  Filled 2020-07-14: qty 1

## 2020-07-14 NOTE — ED Provider Notes (Signed)
[facility] EMERGENCY DEPARTMENT Provider Note   CSN: [id number] Arrival date & time: 07/14/20  1808     History Chief Complaint  Patient presents with  . Puncture Wound    Paul Newton is a 39 y.o. male presents to the emergency department evaluation of puncture wound to the left middle finger. He was using a drill with a Phillips head bit, accidentally slipped and went into his left middle finger. He has a small puncture wound to the distal phalanx of the left middle finger. Tetanus status is unknown. Pain is mild, states he has some numbness to the finger. Bleeding well controlled. No limitations in active range of motion of the digit with flexion extension. HPI     Past Medical History:  Diagnosis Date  . Pneumonia     There are no problems to display for this patient.   Past Surgical History:  Procedure Laterality Date  . LYMPHADENECTOMY     Right neck R/T swelling with airway compromise during bout with pneumonia       Family History  Problem Relation Age of Onset  . Arthritis Mother     Social History   Tobacco Use  . Smoking status: Current Every Day Smoker    Packs/day: 1.00    Types: Cigarettes  . Smokeless tobacco: Never Used  Substance Use Topics  . Alcohol use: Yes    Comment: occasional  . Drug use: No    Home Medications Prior to Admission medications   Medication Sig Start Date End Date Taking? Authorizing Provider  acetaminophen (TYLENOL) 325 MG tablet Take 2 tablets (650 mg total) by mouth every 6 (six) hours as needed. 03/27/15   Angelina Ok, MD  albuterol (PROVENTIL HFA;VENTOLIN HFA) 108 (90 BASE) MCG/ACT inhaler Inhale 2 puffs into the lungs every 6 (six) hours as needed for wheezing. 07/06/12   Johnson, Clanford L, MD  cephALEXin (KEFLEX) 500 MG capsule Take 1 capsule (500 mg total) by mouth 4 (four) times daily for 7 days. 07/14/20 07/21/20  Evon Slack, PA-C  ibuprofen (ADVIL,MOTRIN) 600 MG tablet Take  1 tablet (600 mg total) by mouth every 6 (six) hours as needed. 03/27/15   Angelina Ok, MD  dicyclomine (BENTYL) 20 MG tablet Take 1 tablet (20 mg total) by mouth 2 (two) times daily. 02/14/12 07/06/12  Chilton Si, PA-C    Allergies    Patient has no known allergies.  Review of Systems   Review of Systems  Constitutional: Negative for fever.  Skin: Positive for wound.  Neurological: Positive for numbness.    Physical Exam Updated Vital Signs BP (!) 144/98 (BP Location: Left Arm)   Pulse 81   Temp 98.2 F (36.8 C) (Oral)   Resp 16   Ht 5\' 4"  (1.626 m)   Wt 90.7 kg   SpO2 98%   BMI 34.33 kg/m   Physical Exam Constitutional:      Appearance: He is well-developed and well-nourished.  HENT:     Head: Normocephalic and atraumatic.  Eyes:     Conjunctiva/sclera: Conjunctivae normal.  Cardiovascular:     Rate and Rhythm: Normal rate.  Pulmonary:     Effort: Pulmonary effort is normal. No respiratory distress.  Musculoskeletal:        General: Normal range of motion.     Cervical back: Normal range of motion.     Comments: Left middle finger with small puncture wound to the distal phalanx on the volar aspect. Full active  flexion extension of the digit. Slight sensation discrepancy along the pad of the distal phalanx. No injury to the nail. No exposed bone. No visible palpable foreign body. Digit was soaked in half saline and Betadine, Band-Aid applied.  Skin:    General: Skin is warm.     Findings: No rash.  Neurological:     Mental Status: He is alert and oriented to person, place, and time.  Psychiatric:        Mood and Affect: Mood and affect normal.        Behavior: Behavior normal.        Thought Content: Thought content normal.     ED Results / Procedures / Treatments   Labs (all labs ordered are listed, but only abnormal results are displayed) Labs Reviewed - No data to display  EKG None  Radiology DG Finger Middle Left  Result Date:  07/14/2020 CLINICAL DATA:  Power drill through the finger EXAM: LEFT MIDDLE FINGER 2+V COMPARISON:  None. FINDINGS: There is a soft tissue defect involving the distal aspect of the finger. There is no radiopaque foreign body. No acute displaced fracture or dislocation. There is soft tissue swelling. IMPRESSION: Soft tissue defect involving the distal aspect of the finger. No acute displaced fracture or dislocation. No radiopaque foreign body. Electronically Signed   By: Katherine Mantle M.D.   On: 07/14/2020 18:54    Procedures Procedures (including critical care time)  Medications Ordered in ED Medications  cephALEXin (KEFLEX) capsule 500 mg (has no administration in time range)  Tdap (BOOSTRIX) injection 0.5 mL (has no administration in time range)    ED Course  I have reviewed the triage vital signs and the nursing notes.  Pertinent labs & imaging results that were available during my care of the patient were reviewed by me and considered in my medical decision making (see chart for details).    MDM Rules/Calculators/A&P                          39 year old male with puncture wound to left middle finger with a drill bit. X-ray showed no foreign body or damage to the bone. There appears to be no tendon deficits. Pain well controlled. He is placed on prophylactic antibiotics, given tetanus. His wound was cleansed and new dressing applied. Final Clinical Impression(s) / ED Diagnoses Final diagnoses:  Puncture wound of finger, initial encounter    Rx / DC Orders ED Discharge Orders         Ordered    cephALEXin (KEFLEX) 500 MG capsule  4 times daily        07/14/20 2136           Ronnette Juniper 07/14/20 2142    Merwyn Katos, MD 07/14/20 [phone]

## 2020-07-14 NOTE — Discharge Instructions (Addendum)
Please take antibiotics as prescribed. Soak finger twice daily in half water half peroxide for at least 5 minutes. Please return to the ER for any increased pain swelling warmth redness or drainage.

## 2020-07-14 NOTE — ED Triage Notes (Signed)
Pt reports was using a drill and it punctured through his left hand middle finger. Bleeding controlled

## 2020-09-30 ENCOUNTER — Emergency Department: Payer: 59

## 2020-09-30 ENCOUNTER — Emergency Department
Admission: EM | Admit: 2020-09-30 | Discharge: 2020-09-30 | Disposition: A | Discharge status: Home / Self Care | Payer: 59 | Attending: Emergency Medicine | Admitting: Emergency Medicine

## 2020-09-30 ENCOUNTER — Encounter: Payer: Self-pay | Admitting: Emergency Medicine

## 2020-09-30 ENCOUNTER — Other Ambulatory Visit: Payer: Self-pay

## 2020-09-30 DIAGNOSIS — Z20822 Contact with and (suspected) exposure to covid-19: Secondary | ICD-10-CM | POA: Insufficient documentation

## 2020-09-30 DIAGNOSIS — F17210 Nicotine dependence, cigarettes, uncomplicated: Secondary | ICD-10-CM | POA: Insufficient documentation

## 2020-09-30 DIAGNOSIS — B349 Viral infection, unspecified: Secondary | ICD-10-CM | POA: Insufficient documentation

## 2020-09-30 DIAGNOSIS — R0602 Shortness of breath: Secondary | ICD-10-CM | POA: Diagnosis present

## 2020-09-30 DIAGNOSIS — H9212 Otorrhea, left ear: Secondary | ICD-10-CM | POA: Diagnosis not present

## 2020-09-30 DIAGNOSIS — J209 Acute bronchitis, unspecified: Secondary | ICD-10-CM

## 2020-09-30 LAB — CBC
HCT: 46.2 % (ref 39.0–52.0)
Hemoglobin: 16 g/dL (ref 13.0–17.0)
MCH: 32.1 pg (ref 26.0–34.0)
MCHC: 34.6 g/dL (ref 30.0–36.0)
MCV: 92.6 fL (ref 80.0–100.0)
Platelets: 258 10*3/uL (ref 150–400)
RBC: 4.99 MIL/uL (ref 4.22–5.81)
RDW: 13 % (ref 11.5–15.5)
WBC: 11 10*3/uL — ABNORMAL HIGH (ref 4.0–10.5)
nRBC: 0 % (ref 0.0–0.2)

## 2020-09-30 LAB — BASIC METABOLIC PANEL
Anion gap: 7 (ref 5–15)
BUN: 16 mg/dL (ref 6–20)
CO2: 25 mmol/L (ref 22–32)
Calcium: 9.4 mg/dL (ref 8.9–10.3)
Chloride: 102 mmol/L (ref 98–111)
Creatinine, Ser: 0.98 mg/dL (ref 0.61–1.24)
GFR, Estimated: >60 mL/min (ref >60)
Glucose, Bld: 115 mg/dL — ABNORMAL HIGH (ref 70–99)
Potassium: 3.9 mmol/L (ref 3.5–5.1)
Sodium: 134 mmol/L — ABNORMAL LOW (ref 135–145)

## 2020-09-30 LAB — RESP PANEL BY RT-PCR (FLU A&B, COVID) ARPGX2
Influenza A by PCR: NEGATIVE
Influenza B by PCR: NEGATIVE
SARS Coronavirus 2 by RT PCR: NEGATIVE

## 2020-09-30 MED ORDER — ALBUTEROL SULFATE HFA 108 (90 BASE) MCG/ACT IN AERS: 2.0000 | INHALATION_SPRAY | RESPIRATORY_TRACT | 0 refills | Status: DC | PRN

## 2020-09-30 MED ORDER — PSEUDOEPHEDRINE HCL 60 MG PO TABS: 60.0000 mg | ORAL_TABLET | Freq: Four times a day (QID) | ORAL | 0 refills | Status: DC | PRN

## 2020-09-30 NOTE — ED Provider Notes (Signed)
[facility] Emergency Department Provider Note ____________________________________________   Event Date/Time   First MD Initiated Contact with Patient 09/30/20 2001     (approximate)  I have reviewed the triage vital signs and the nursing notes.   HISTORY  Chief Complaint Shortness of Breath    HPI Paul Newton is a 40 y.o. male with no significant past medical history who presents with shortness of breath over the last few days, gradual onset, worse with exertion, and associated with a nonproductive cough as well as with nasal congestion and left ear pain and drainage.  He reports some diarrhea initially about a week ago but this has mostly subsided and he has been eating and drinking normally.  He reports some body aches and malaise.  He has no specific sick contacts.  He took a rapid home Covid test a few days ago which was negative.  Past Medical History:  Diagnosis Date  . Pneumonia     There are no problems to display for this patient.   Past Surgical History:  Procedure Laterality Date  . LYMPHADENECTOMY     Right neck R/T swelling with airway compromise during bout with pneumonia    Prior to Admission medications   Medication Sig Start Date End Date Taking? Authorizing Provider  albuterol (VENTOLIN HFA) 108 (90 Base) MCG/ACT inhaler Inhale 2 puffs into the lungs every 4 (four) hours as needed for wheezing or shortness of breath. 09/30/20  Yes Dionne Bucy, MD  pseudoephedrine (SUDAFED) 60 MG tablet Take 1 tablet (60 mg total) by mouth every 6 (six) hours as needed for congestion. 09/30/20  Yes Dionne Bucy, MD  acetaminophen (TYLENOL) 325 MG tablet Take 2 tablets (650 mg total) by mouth every 6 (six) hours as needed. 03/27/15   Angelina Ok, MD  ibuprofen (ADVIL,MOTRIN) 600 MG tablet Take 1 tablet (600 mg total) by mouth every 6 (six) hours as needed. 03/27/15   Angelina Ok, MD  dicyclomine (BENTYL) 20 MG tablet Take 1  tablet (20 mg total) by mouth 2 (two) times daily. 02/14/12 07/06/12  Chilton Si, PA-C    Allergies Patient has no known allergies.  Family History  Problem Relation Age of Onset  . Arthritis Mother     Social History Social History   Tobacco Use  . Smoking status: Current Every Day Smoker    Packs/day: 1.00    Types: Cigarettes  . Smokeless tobacco: Never Used  Substance Use Topics  . Alcohol use: Yes    Comment: occasional  . Drug use: No    Review of Systems  Constitutional: No fever. Eyes: No redness. ENT: No sore throat. Cardiovascular: Denies chest pain. Respiratory: Positive for shortness of breath. Gastrointestinal: Positive for resolved diarrhea. Genitourinary: Negative for dysuria.  Musculoskeletal: Negative for back pain. Skin: Negative for rash. Neurological: Positive for mild headache.   ____________________________________________   PHYSICAL EXAM:  VITAL SIGNS: ED Triage Vitals [09/30/20 1813]  Enc Vitals Group     BP (!) 151/99     Pulse Rate 77     Resp 16     Temp 98.2 F (36.8 C)     Temp Source Oral     SpO2 98 %     Weight 203 lb (92.1 kg)     Height 5\' 4"  (1.626 m)     Head Circumference      Peak Flow      Pain Score 0     Pain Loc  Pain Edu?      Excl. in GC?     Constitutional: Alert and oriented. Well appearing and in no acute distress. Eyes: Conjunctivae are normal.  Head: Atraumatic.  Left TM with slight erythema and obscured by serous drainage. Nose: No congestion/rhinnorhea. Mouth/Throat: Mucous membranes are moist.  Oropharynx clear. Neck: Normal range of motion.  Cardiovascular: Normal rate, regular rhythm. Grossly normal heart sounds.  Good peripheral circulation. Respiratory: Normal respiratory effort.  No retractions. Lungs CTAB. Gastrointestinal: No distention.  Musculoskeletal: Extremities warm and well perfused.  Neurologic:  Normal speech and language. No gross focal neurologic deficits are  appreciated.  Skin:  Skin is warm and dry. No rash noted. Psychiatric: Mood and affect are normal. Speech and behavior are normal.  ____________________________________________   LABS (all labs ordered are listed, but only abnormal results are displayed)  Labs Reviewed  CBC - Abnormal; Notable for the following components:      Result Value   WBC 11.0 (*)    All other components within normal limits  BASIC METABOLIC PANEL - Abnormal; Notable for the following components:   Sodium 134 (*)    Glucose, Bld 115 (*)    All other components within normal limits  RESP PANEL BY RT-PCR (FLU A&B, COVID) ARPGX2   ____________________________________________  EKG  ED ECG REPORT I, Dionne Bucy, the attending physician, personally viewed and interpreted this ECG.  Date: 09/30/2020 EKG Time: 1815 Rate: 89 Rhythm: normal sinus rhythm QRS Axis: Right axis Intervals: normal ST/T Wave abnormalities: normal Narrative Interpretation: no evidence of acute ischemia  ____________________________________________  RADIOLOGY  Chest x-ray no focal infiltrate or edema  ____________________________________________   PROCEDURES  Procedure(s) performed: No  Procedures  Critical Care performed: No ____________________________________________   INITIAL IMPRESSION / ASSESSMENT AND PLAN / ED COURSE  Pertinent labs & imaging results that were available during my care of the patient were reviewed by me and considered in my medical decision making (see chart for details).  40 year old male with no active medical problems presents with several days of shortness of breath, nasal congestion, rhinorrhea, left ear drainage, as well as some diarrhea initially that has resolved.  On exam, the patient is overall well-appearing.  His vital signs are normal except for mild hypertension.  The physical exam is unremarkable except for some serous drainage in the left ear canal and TM.  Overall  presentation is consistent with a viral syndrome.  Differential includes COVID-19, acute bronchitis, or less likely pneumonia.  Chest x-ray shows no focal infiltrate or edema.  Basic labs are unremarkable.  There is no clinical evidence for PE and the patient is PERC negative.  There is also no evidence for cardiac cause.  EKG is nonischemic.  We will add on a Covid PCR test.  If this is negative I anticipate discharge home.  ----------------------------------------- 9:47 PM on 09/30/2020 -----------------------------------------  Covid is negative.  The patient is stable for discharge home.  I have prescribed albuterol and pseudoephedrine.  Return precautions given, and he expresses understanding. ____________________________________________   FINAL CLINICAL IMPRESSION(S) / ED DIAGNOSES  Final diagnoses:  Viral syndrome  Acute bronchitis, unspecified organism      NEW MEDICATIONS STARTED DURING THIS VISIT:  New Prescriptions   ALBUTEROL (VENTOLIN HFA) 108 (90 BASE) MCG/ACT INHALER    Inhale 2 puffs into the lungs every 4 (four) hours as needed for wheezing or shortness of breath.   PSEUDOEPHEDRINE (SUDAFED) 60 MG TABLET    Take 1 tablet (60 mg total)  by mouth every 6 (six) hours as needed for congestion.     Note:  This document was prepared using Dragon voice recognition software and may include unintentional dictation errors.    Dionne Bucy, MD 09/30/20 2147

## 2020-09-30 NOTE — ED Triage Notes (Signed)
Pt via POV from home. Pt c/o SOB for the past 3 days. Pt states he has had some NVD for about a week. Denies cough. Denies fevers. Pt took at home COVID test 4 days ago which was negative. Pt is unvaccinated. Denies being around anyone sick. Pt is A&Ox4 and NAD.

## 2020-09-30 NOTE — Discharge Instructions (Signed)
Return to the ER for new, worsening, or persistent severe shortness of breath, weakness, high fever, vomiting, chest pain or any other new or worsening symptoms that concern you.

## 2020-10-01 DIAGNOSIS — H9222 Otorrhagia, left ear: Secondary | ICD-10-CM | POA: Insufficient documentation

## 2020-10-01 DIAGNOSIS — H9012 Conductive hearing loss, unilateral, left ear, with unrestricted hearing on the contralateral side: Secondary | ICD-10-CM | POA: Insufficient documentation

## 2020-10-25 ENCOUNTER — Ambulatory Visit: Payer: Self-pay | Admitting: Nurse Practitioner

## 2021-03-29 ENCOUNTER — Emergency Department
Admission: EM | Admit: 2021-03-29 | Discharge: 2021-03-29 | Disposition: A | Discharge status: Home / Self Care | Payer: 59 | Attending: Emergency Medicine | Admitting: Emergency Medicine

## 2021-03-29 ENCOUNTER — Other Ambulatory Visit: Payer: Self-pay

## 2021-03-29 ENCOUNTER — Emergency Department: Payer: 59

## 2021-03-29 DIAGNOSIS — W010XXA Fall on same level from slipping, tripping and stumbling without subsequent striking against object, initial encounter: Secondary | ICD-10-CM | POA: Diagnosis not present

## 2021-03-29 DIAGNOSIS — F17210 Nicotine dependence, cigarettes, uncomplicated: Secondary | ICD-10-CM | POA: Insufficient documentation

## 2021-03-29 DIAGNOSIS — S6991XA Unspecified injury of right wrist, hand and finger(s), initial encounter: Secondary | ICD-10-CM | POA: Insufficient documentation

## 2021-03-29 MED ORDER — IBUPROFEN 400 MG PO TABS
400.0000 mg | ORAL_TABLET | Freq: Once | ORAL | Status: DC
  Filled 2021-03-29: qty 1

## 2021-03-29 NOTE — ED Triage Notes (Signed)
Pt reports injury to right hand middle finger. NAD noted at this time.

## 2021-03-29 NOTE — ED Notes (Addendum)
Pt advised he fell off ladder and hurt his right hand. Pt caught himself with his hand. Pt is CAO and in no distress. No vitals obtained in triage.

## 2021-03-29 NOTE — ED Provider Notes (Signed)
[facility]  ____________________________________________   Event Date/Time   First MD Initiated Contact with Patient 03/29/21 1843     (approximate)  I have reviewed the triage vital signs and the nursing notes.   HISTORY  Chief Complaint Finger Injury    HPI CLEMENT DENEAULT is a 40 y.o. male with no significant past medical history presents with injury to the right third digit.  Patient was stepping onto a ladder when he slipped and fell backwards.  He caught his fall with his right hand.  This occurred about 4 hours prior to arrival.  He has had progressive pain and tightness of the right third digit.  He denies numbness or weakness.  Denies hitting his head.  Denies other injury.  He is right-handed.         Past Medical History:  Diagnosis Date   Pneumonia     There are no problems to display for this patient.   Past Surgical History:  Procedure Laterality Date   LYMPHADENECTOMY     Right neck R/T swelling with airway compromise during bout with pneumonia    Prior to Admission medications   Medication Sig Start Date End Date Taking? Authorizing Provider  acetaminophen (TYLENOL) 325 MG tablet Take 2 tablets (650 mg total) by mouth every 6 (six) hours as needed. 03/27/15   Angelina Ok, MD  albuterol (VENTOLIN HFA) 108 (90 Base) MCG/ACT inhaler Inhale 2 puffs into the lungs every 4 (four) hours as needed for wheezing or shortness of breath. 09/30/20   Dionne Bucy, MD  ibuprofen (ADVIL,MOTRIN) 600 MG tablet Take 1 tablet (600 mg total) by mouth every 6 (six) hours as needed. 03/27/15   Angelina Ok, MD  pseudoephedrine (SUDAFED) 60 MG tablet Take 1 tablet (60 mg total) by mouth every 6 (six) hours as needed for congestion. 09/30/20   Dionne Bucy, MD  dicyclomine (BENTYL) 20 MG tablet Take 1 tablet (20 mg total) by mouth 2 (two) times daily. 02/14/12 07/06/12  Chilton Si, PA-C    Allergies Patient has no known  allergies.  Family History  Problem Relation Age of Onset   Arthritis Mother     Social History Social History   Tobacco Use   Smoking status: Every Day    Packs/day: 1.00    Types: Cigarettes   Smokeless tobacco: Never  Substance Use Topics   Alcohol use: Yes    Comment: occasional   Drug use: Not Currently    Review of Systems   Review of Systems  Musculoskeletal:  Positive for arthralgias and myalgias.  Skin:  Negative for wound.  Neurological:  Negative for weakness, numbness and headaches.  All other systems reviewed and are negative.  Physical Exam Updated Vital Signs BP 137/86 (BP Location: Right Arm)   Pulse 86   Temp 98 F (36.7 C) (Oral)   Resp 16   Ht 5\' 5"  (1.651 m)   Wt 90.7 kg   SpO2 99%   BMI 33.28 kg/m   Physical Exam Vitals and nursing note reviewed.  Constitutional:      General: He is not in acute distress.    Appearance: Normal appearance.  HENT:     Head: Normocephalic and atraumatic.  Eyes:     General: No scleral icterus.    Conjunctiva/sclera: Conjunctivae normal.  Pulmonary:     Effort: Pulmonary effort is normal. No respiratory distress.     Breath sounds: Normal breath sounds. No wheezing.  Musculoskeletal:  General: Signs of injury present. No deformity.     Cervical back: Normal range of motion.     Comments: Mild tenderness to palpation over the right 4th PIP, which is mildly erythematous and ecchymotic, able to range in flexion and extension, sensation grossly intact, 2+ radial pulse, no other tenderness to the wrist elbow or shoulder  Skin:    Coloration: Skin is not jaundiced or pale.  Neurological:     General: No focal deficit present.     Mental Status: He is alert and oriented to person, place, and time. Mental status is at baseline.  Psychiatric:        Mood and Affect: Mood normal.        Behavior: Behavior normal.     LABS (all labs ordered are listed, but only abnormal results are displayed)  Labs  Reviewed - No data to display ____________________________________________  EKG  N/a ____________________________________________  RADIOLOGY I, Randol Kern, personally viewed and evaluated these images (plain radiographs) as part of my medical decision making, as well as reviewing the written report by the radiologist.  ED MD interpretation: I reviewed the x-ray of the hand which does not show any acute fracture dislocation    ____________________________________________   PROCEDURES  Procedure(s) performed (including Critical Care):  Procedures   ____________________________________________   INITIAL IMPRESSION / ASSESSMENT AND PLAN / ED COURSE     40 year old male presents with an injury to the right 4th digit after falling from a low distance off a ladder.  On exam there is some swelling and mild erythema and bruising over the right 4th PIP.  X-ray was obtained which does not show an acute fracture dislocation.  Patient was placed in a splint in extension.  Advised to follow-up with primary care provider as well as orthopedics if he has ongoing issues with the finger.  Clinical Course as of 03/29/21 1916  Sat Mar 29, 2021  1906 1. No acute displaced fracture. 2. Mild osteoarthritis fourth proximal interphalangeal joint.   [KM]    Clinical Course User Index [KM] Georga Hacking, MD     ____________________________________________   FINAL CLINICAL IMPRESSION(S) / ED DIAGNOSES  Final diagnoses:  Injury of finger of right hand, initial encounter     ED Discharge Orders     None        Note:  This document was prepared using Dragon voice recognition software and may include unintentional dictation errors.    Georga Hacking, MD 03/29/21 [phone]

## 2021-04-30 ENCOUNTER — Emergency Department: Payer: 59

## 2021-04-30 ENCOUNTER — Other Ambulatory Visit: Payer: Self-pay

## 2021-04-30 ENCOUNTER — Emergency Department
Admission: EM | Admit: 2021-04-30 | Discharge: 2021-04-30 | Disposition: A | Discharge status: Home / Self Care | Payer: 59 | Attending: Emergency Medicine | Admitting: Emergency Medicine

## 2021-04-30 DIAGNOSIS — R42 Dizziness and giddiness: Secondary | ICD-10-CM | POA: Diagnosis not present

## 2021-04-30 DIAGNOSIS — S2231XA Fracture of one rib, right side, initial encounter for closed fracture: Secondary | ICD-10-CM

## 2021-04-30 DIAGNOSIS — K2920 Alcoholic gastritis without bleeding: Secondary | ICD-10-CM | POA: Diagnosis not present

## 2021-04-30 DIAGNOSIS — S299XXA Unspecified injury of thorax, initial encounter: Secondary | ICD-10-CM | POA: Diagnosis present

## 2021-04-30 DIAGNOSIS — F17210 Nicotine dependence, cigarettes, uncomplicated: Secondary | ICD-10-CM | POA: Diagnosis not present

## 2021-04-30 LAB — BASIC METABOLIC PANEL
Anion gap: 8 (ref 5–15)
BUN: 13 mg/dL (ref 6–20)
CO2: 24 mmol/L (ref 22–32)
Calcium: 8.9 mg/dL (ref 8.9–10.3)
Chloride: 101 mmol/L (ref 98–111)
Creatinine, Ser: 1.06 mg/dL (ref 0.61–1.24)
GFR, Estimated: >60 mL/min (ref >60)
Glucose, Bld: 107 mg/dL — ABNORMAL HIGH (ref 70–99)
Potassium: 3.6 mmol/L (ref 3.5–5.1)
Sodium: 133 mmol/L — ABNORMAL LOW (ref 135–145)

## 2021-04-30 LAB — CBC
HCT: 45.3 % (ref 39.0–52.0)
Hemoglobin: 16.4 g/dL (ref 13.0–17.0)
MCH: 34 pg (ref 26.0–34.0)
MCHC: 36.2 g/dL — ABNORMAL HIGH (ref 30.0–36.0)
MCV: 93.8 fL (ref 80.0–100.0)
Platelets: 265 10*3/uL (ref 150–400)
RBC: 4.83 MIL/uL (ref 4.22–5.81)
RDW: 12.8 % (ref 11.5–15.5)
WBC: 12.7 10*3/uL — ABNORMAL HIGH (ref 4.0–10.5)
nRBC: 0 % (ref 0.0–0.2)

## 2021-04-30 LAB — HEPATIC FUNCTION PANEL
ALT: 22 U/L (ref 0–44)
AST: 23 U/L (ref 15–41)
Albumin: 3.9 g/dL (ref 3.5–5.0)
Alkaline Phosphatase: 68 U/L (ref 38–126)
Bilirubin, Direct: 0.1 mg/dL (ref 0.0–0.2)
Indirect Bilirubin: 0.7 mg/dL (ref 0.3–0.9)
Total Bilirubin: 0.8 mg/dL (ref 0.3–1.2)
Total Protein: 7.4 g/dL (ref 6.5–8.1)

## 2021-04-30 LAB — LIPASE, BLOOD: Lipase: 102 U/L — ABNORMAL HIGH (ref 11–51)

## 2021-04-30 MED ORDER — IOHEXOL 350 MG/ML SOLN
80.0000 mL | Freq: Once | INTRAVENOUS | Status: AC | PRN
  Administered 2021-04-30: 80 mL via INTRAVENOUS

## 2021-04-30 MED ORDER — SODIUM CHLORIDE 0.9 % IV BOLUS
1000.0000 mL | Freq: Once | INTRAVENOUS | Status: AC
  Administered 2021-04-30: 1000 mL via INTRAVENOUS

## 2021-04-30 MED ORDER — ONDANSETRON 4 MG PO TBDP: 4.0000 mg | ORAL_TABLET | Freq: Four times a day (QID) | ORAL | 0 refills | Status: DC | PRN

## 2021-04-30 MED ORDER — ONDANSETRON HCL 4 MG/2ML IJ SOLN
4.0000 mg | INTRAMUSCULAR | Status: AC
  Administered 2021-04-30: 4 mg via INTRAVENOUS
  Filled 2021-04-30: qty 2

## 2021-04-30 MED ORDER — KETOROLAC TROMETHAMINE 30 MG/ML IJ SOLN
30.0000 mg | Freq: Once | INTRAMUSCULAR | Status: AC
  Administered 2021-04-30: 30 mg via INTRAVENOUS
  Filled 2021-04-30: qty 1

## 2021-04-30 MED ORDER — IBUPROFEN 800 MG PO TABS: 800.0000 mg | ORAL_TABLET | Freq: Three times a day (TID) | ORAL | 0 refills | Status: DC | PRN
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
## 2021-05-19 ENCOUNTER — Telehealth: Payer: 59 | Admitting: Physician Assistant

## 2021-05-19 DIAGNOSIS — U071 COVID-19: Secondary | ICD-10-CM

## 2021-05-20 MED ORDER — BENZONATATE 100 MG PO CAPS
100.0000 mg | ORAL_CAPSULE | Freq: Three times a day (TID) | ORAL | 0 refills | Status: DC | PRN
Start: 2021-05-20 — End: 2021-10-15

## 2021-05-20 NOTE — Progress Notes (Signed)

## 2021-05-20 NOTE — Progress Notes (Signed)
I have spent 5 minutes in review of e-visit questionnaire, review and updating patient chart, medical decision making and response to patient.   Caz Weaver Cody Eiman Maret, PA-C    

## 2021-05-21 ENCOUNTER — Telehealth: Payer: 59 | Admitting: Physician Assistant

## 2021-05-21 DIAGNOSIS — K2990 Gastroduodenitis, unspecified, without bleeding: Secondary | ICD-10-CM

## 2021-05-21 DIAGNOSIS — U071 COVID-19: Secondary | ICD-10-CM

## 2021-05-21 MED ORDER — PANTOPRAZOLE SODIUM 40 MG PO TBEC: 40.0000 mg | DELAYED_RELEASE_TABLET | Freq: Every day | ORAL | 3 refills | Status: DC

## 2021-05-21 MED ORDER — PROMETHAZINE HCL 12.5 MG PO TABS
12.5000 mg | ORAL_TABLET | Freq: Three times a day (TID) | ORAL | 0 refills | Status: DC | PRN
Start: 2021-05-21 — End: 2021-10-15

## 2021-05-21 MED ORDER — MOLNUPIRAVIR EUA 200MG CAPSULE: 4.0000 | ORAL_CAPSULE | Freq: Two times a day (BID) | ORAL | 0 refills | Status: AC

## 2021-05-21 NOTE — Progress Notes (Signed)
Virtual Visit Consent   Paul Newton, you are scheduled for a virtual visit with a [facility] provider today.     Just as with appointments in the office, your consent must be obtained to participate.  Your consent will be active for this visit and any virtual visit you may have with one of our providers in the next 365 days.     If you have a MyChart account, a copy of this consent can be sent to you electronically.  All virtual visits are billed to your insurance company just like a traditional visit in the office.    As this is a virtual visit, video technology does not allow for your provider to perform a traditional examination.  This may limit your provider's ability to fully assess your condition.  If your provider identifies any concerns that need to be evaluated in person or the need to arrange testing (such as labs, EKG, etc.), we will make arrangements to do so.     Although advances in technology are sophisticated, we cannot ensure that it will always work on either your end or our end.  If the connection with a video visit is poor, the visit may have to be switched to a telephone visit.  With either a video or telephone visit, we are not always able to ensure that we have a secure connection.     I need to obtain your verbal consent now.   Are you willing to proceed with your visit today?    Paul Newton has provided verbal consent on 05/21/2021 for a virtual visit (video or telephone).   Paul Newton, [place]   Date: 05/21/2021 3:38 PM   Virtual Visit via Video Note   I, Paul Newton, connected with  Paul Newton  ([id number], Sep 03, 1980) on 05/21/21 at  3:30 PM EDT by a video-enabled telemedicine application and verified that I am speaking with the correct person using two identifiers.  Location: Patient: Virtual Visit Location Patient: Home Provider: Virtual Visit Location Provider: Home Office   I discussed the limitations of evaluation and  management by telemedicine and the availability of in person appointments. The patient expressed understanding and agreed to proceed.    History of Present Illness: Paul Newton is a 40 y.o. who identifies as a male who was assigned male at birth, and is being seen today for COVID-19. Was initially seen and diagnosed via e-visit on 05/18/2021. Was started on supportive . Several episodes of retching with emesis. Now with belching and heartburn. No fever at present but sweating a lot. Still having chest congestion but manageable.  Some abdominal wall tenderness today.  Is keeping hydrated but limiting food intake. Respiratory symptoms are stable and non worsening. Denies chest pain or SOB.  HPI: HPI  Problems: There are no problems to display for this patient.   Allergies: No Known Allergies Medications:  Current Outpatient Medications:    molnupiravir EUA (LAGEVRIO) 200 mg CAPS capsule, Take 4 capsules (800 mg total) by mouth 2 (two) times daily for 5 days., Disp: 40 capsule, Rfl: 0   pantoprazole (PROTONIX) 40 MG tablet, Take 1 tablet (40 mg total) by mouth daily., Disp: 30 tablet, Rfl: 3   promethazine (PHENERGAN) 12.5 MG tablet, Take 1 tablet (12.5 mg total) by mouth every 8 (eight) hours as needed for nausea or vomiting., Disp: 20 tablet, Rfl: 0   benzonatate (TESSALON) 100 MG capsule, Take 1 capsule (100 mg total) by mouth 3 (  three) times daily as needed for cough., Disp: 30 capsule, Rfl: 0  Observations/Objective: Patient is well-developed, well-nourished in no acute distress.  Resting comfortably on bed at home.  Head is normocephalic, atraumatic.  No labored breathing. Speech is clear and coherent with logical content.  Patient is alert and oriented at baseline.   Assessment and Plan: 1. COVID-19 - molnupiravir EUA (LAGEVRIO) 200 mg CAPS capsule; Take 4 capsules (800 mg total) by mouth 2 (two) times daily for 5 days.  Dispense: 40 capsule; Refill: 0 Giving his substantial URI  symptoms are indirectly exacerbating reglux and causing gastritis, will add on antiviral medication at this point. Is candidate for molnupiravir. Reviewed risks/benefits and ADRs. He would like to proceed. Continue supportive measures and medications reviewed at last visit. Strict ER precautions reviewed.   2. Gastritis and duodenitis - promethazine (PHENERGAN) 12.5 MG tablet; Take 1 tablet (12.5 mg total) by mouth every 8 (eight) hours as needed for nausea or vomiting.  Dispense: 20 tablet; Refill: 0 - pantoprazole (PROTONIX) 40 MG tablet; Take 1 tablet (40 mg total) by mouth daily.  Dispense: 30 tablet; Refill: 3 Likely exacerbated secondary to PND and the chest phlegm he is coughing up and swallowing. Now with gastritis. Will have him stop OTC NSAID. GERD diet reviewed. Start Pantoprazole daily for the next 10 days. Rx promethazine for nausea and vomiting. Strict ER precautions reviewed.   Follow Up Instructions: I discussed the assessment and treatment plan with the patient. The patient was provided an opportunity to ask questions and all were answered. The patient agreed with the plan and demonstrated an understanding of the instructions.  A copy of instructions were sent to the patient via MyChart unless otherwise noted below.   The patient was advised to call back or seek an in-person evaluation if the symptoms worsen or if the condition fails to improve as anticipated.  Time:  I spent 15 minutes with the patient via telehealth technology discussing the above problems/concerns.    Paul Climes, PA-C

## 2021-05-21 NOTE — Patient Instructions (Signed)
  Paul Newton, thank you for joining Paul Climes, Paul Newton for today's virtual visit.  While this provider is not your primary care provider (PCP), if your PCP is located in our provider database this encounter information will be shared with them immediately following your visit.  Consent: (Patient) Paul Newton provided verbal consent for this virtual visit at the beginning of the encounter.  Current Medications:  Current Outpatient Medications:    acetaminophen (TYLENOL) 325 MG tablet, Take 2 tablets (650 mg total) by mouth every 6 (six) hours as needed., Disp: 30 tablet, Rfl: 0   albuterol (VENTOLIN HFA) 108 (90 Base) MCG/ACT inhaler, Inhale 2 puffs into the lungs every 4 (four) hours as needed for wheezing or shortness of breath., Disp: 8 g, Rfl: 0   benzonatate (TESSALON) 100 MG capsule, Take 1 capsule (100 mg total) by mouth 3 (three) times daily as needed for cough., Disp: 30 capsule, Rfl: 0   ibuprofen (ADVIL) 800 MG tablet, Take 1 tablet (800 mg total) by mouth every 8 (eight) hours as needed., Disp: 30 tablet, Rfl: 0   ondansetron (ZOFRAN ODT) 4 MG disintegrating tablet, Take 1 tablet (4 mg total) by mouth every 6 (six) hours as needed for nausea or vomiting., Disp: 20 tablet, Rfl: 0   pseudoephedrine (SUDAFED) 60 MG tablet, Take 1 tablet (60 mg total) by mouth every 6 (six) hours as needed for congestion., Disp: 30 tablet, Rfl: 0   Medications ordered in this encounter:  No orders of the defined types were placed in this encounter.    *If you need refills on other medications prior to your next appointment, please contact your pharmacy*  Follow-Up: Call back or seek an in-person evaluation if the symptoms worsen or if the condition fails to improve as anticipated.  Other Instructions Please continue to keep well-hydrated and try to rest. Continue recommendations given at last visit.  Stop the Ibuprofen/Motrin. You can continue use of Tylenol. Start the  Promethazine as directed when needed for nausea. Start the Protonix as directed for 7-10 days. You can also consider adding OTC Pepcid each evening for the next few nights to further help with reflux and gastritis while the Protonix is building up in your system.   Take the antiviral medication as directed.  If symptoms continue to worsen despite treatment or you are unable to keep fluids/medicine in your system, you need to go to the ER.    If you have been instructed to have an in-person evaluation today at a local Urgent Care facility, please use the link below. It will take you to a list of all of our available [facility], including address, phone number and hours of operation. Please do not delay care.  [facility]  If you or a family member do not have a primary care provider, use the link below to schedule a visit and establish care. When you choose a [facility] primary care physician or advanced practice provider, you gain a long-term partner in health. Find a Primary Care Provider  Learn more about [facility] in-office and virtual care options: [facility] - Get Care Now

## 2021-10-15 ENCOUNTER — Other Ambulatory Visit: Payer: Self-pay | Admitting: Podiatry

## 2021-10-15 ENCOUNTER — Encounter: Payer: Self-pay | Admitting: Podiatry

## 2021-10-15 ENCOUNTER — Ambulatory Visit (INDEPENDENT_AMBULATORY_CARE_PROVIDER_SITE_OTHER): Payer: 59

## 2021-10-15 ENCOUNTER — Other Ambulatory Visit: Payer: Self-pay

## 2021-10-15 ENCOUNTER — Ambulatory Visit: Payer: 59 | Admitting: Podiatry

## 2021-10-15 DIAGNOSIS — M778 Other enthesopathies, not elsewhere classified: Secondary | ICD-10-CM

## 2021-10-15 DIAGNOSIS — M2011 Hallux valgus (acquired), right foot: Secondary | ICD-10-CM | POA: Diagnosis not present

## 2021-10-15 DIAGNOSIS — M2041 Other hammer toe(s) (acquired), right foot: Secondary | ICD-10-CM

## 2021-10-15 MED ORDER — MELOXICAM 15 MG PO TABS: 15.0000 mg | ORAL_TABLET | Freq: Every day | ORAL | 3 refills | Status: DC

## 2021-10-15 MED ORDER — METHYLPREDNISOLONE 4 MG PO TBPK: ORAL_TABLET | ORAL | 0 refills | Status: DC

## 2021-10-15 MED ORDER — TRIAMCINOLONE ACETONIDE 40 MG/ML IJ SUSP
20.0000 mg | Freq: Once | INTRAMUSCULAR | Status: AC
  Administered 2021-10-15: 20 mg

## 2021-10-15 NOTE — Progress Notes (Signed)
?  Subjective:  ?Patient ID: Paul Newton, male    DOB: [birth year],  MRN: [id number] ?HPI ?Chief Complaint  ?Patient presents with  ? Foot Pain  ?  Plantar forefoot right - aching x couple months, no injury, no treatment  ? New Patient (Initial Visit)  ? ? ?41 y.o. male presents with the above complaint.  ? ?ROS: Denies fever chills nausea vomiting muscle aches pains calf pain back pain chest pain shortness of breath. ? ?Past Medical History:  ?Diagnosis Date  ? Pneumonia   ? ?Past Surgical History:  ?Procedure Laterality Date  ? LYMPHADENECTOMY    ? Right neck R/T swelling with airway compromise during bout with pneumonia  ? ? ?Current Outpatient Medications:  ?  meloxicam (MOBIC) 15 MG tablet, Take 1 tablet (15 mg total) by mouth daily., Disp: 30 tablet, Rfl: 3 ?  methylPREDNISolone (MEDROL DOSEPAK) 4 MG TBPK tablet, 6 day dose pack - take as directed, Disp: 21 tablet, Rfl: 0 ? ?Allergies  ?Allergen Reactions  ? Hyoscyamine   ?  Other reaction(s): Other (See Comments) ?Felt like he couldn't swallow.   ? ?Review of Systems ?Objective:  ?There were no vitals filed for this visit. ? ?General: Well developed, nourished, in no acute distress, alert and oriented x3  ? ?Dermatological: Skin is warm, dry and supple bilateral. Nails x 10 are well maintained; remaining integument appears unremarkable at this time. There are no open sores, no preulcerative lesions, no rash or signs of infection present. ? ?Vascular: Dorsalis Pedis artery and Posterior Tibial artery pedal pulses are 2/4 bilateral with immedate capillary fill time. Pedal hair growth present. No varicosities and no lower extremity edema present bilateral.  ? ?Neruologic: Grossly intact via light touch bilateral. Vibratory intact via tuning fork bilateral. Protective threshold with Semmes Wienstein monofilament intact to all pedal sites bilateral. Patellar and Achilles deep tendon reflexes 2+ bilateral. No Babinski or clonus noted bilateral.   ? ?Musculoskeletal: No gross boney pedal deformities bilateral. No pain, crepitus, or limitation noted with foot and ankle range of motion bilateral. Muscular strength 5/5 in all groups tested bilateral.  Pain on palpation and written range of motion of the second metatarsophalangeal joint of the right foot with mild edema on the plantar aspect of the second metatarsophalangeal joint right foot.  Mild hallux valgus deformities bilateral.  Mild hammertoe deformity second right. ? ?Gait: Unassisted, Nonantalgic.  ? ? ?Radiographs: ? ?Radiographs taken today demonstrate an osseously mature individual right foot increase in the first intermetatarsal angle and hallux abductus angle consistent with hallux valgus.  Elongated second metatarsal with a mild dorsiflexed hammertoe deformity at the level of the metatarsal phalangeal joint consistent with hammertoe and possible capsulitis. ? ?Assessment & Plan:  ? ?Assessment: Capsulitis and hammertoe deformity with mild bunion deformity second metatarsophalangeal joint right foot. ? ?Plan: Discussed etiology pathology conservative surgical therapies at this point we discussed appropriate shoe gear stretching exercise ice therapy and sugar modifications.  I injected around the joint today with 10 mg Kenalog 5 mg Marcaine.  Started him on methylprednisolone to be followed by meloxicam.  I like to follow-up with him in a couple of weeks I did discuss with him that we may need to get orthotics made for him.  He understands and is amenable to it. ? ? ? ? ?Cayleb Jarnigan T. [place], DPM ?

## 2021-12-01 ENCOUNTER — Telehealth: Payer: Self-pay | Admitting: Nurse Practitioner

## 2021-12-01 DIAGNOSIS — J0140 Acute pansinusitis, unspecified: Secondary | ICD-10-CM

## 2021-12-01 MED ORDER — AMOXICILLIN-POT CLAVULANATE 875-125 MG PO TABS
1.0000 | ORAL_TABLET | Freq: Two times a day (BID) | ORAL | 0 refills | Status: AC
Start: 2021-12-01 — End: 2021-12-08

## 2021-12-01 NOTE — Progress Notes (Signed)
?Virtual Visit Consent  ? ?Helane Rima Eichel, you are scheduled for a virtual visit with a [facility] provider today. Just as with appointments in the office, your consent must be obtained to participate. Your consent will be active for this visit and any virtual visit you may have with one of our providers in the next 365 days. If you have a MyChart account, a copy of this consent can be sent to you electronically. ? ?As this is a virtual visit, video technology does not allow for your provider to perform a traditional examination. This may limit your provider's ability to fully assess your condition. If your provider identifies any concerns that need to be evaluated in person or the need to arrange testing (such as labs, EKG, etc.), we will make arrangements to do so. Although advances in technology are sophisticated, we cannot ensure that it will always work on either your end or our end. If the connection with a video visit is poor, the visit may have to be switched to a telephone visit. With either a video or telephone visit, we are not always able to ensure that we have a secure connection. ? ?By engaging in this virtual visit, you consent to the provision of healthcare and authorize for your insurance to be billed (if applicable) for the services provided during this visit. Depending on your insurance coverage, you may receive a charge related to this service. ? ?I need to obtain your verbal consent now. Are you willing to proceed with your visit today? [name] has provided verbal consent on 12/01/2021 for a virtual visit (video or telephone). [name], FNP ? ?Date: 12/01/2021 6:54 PM ? ?Virtual Visit via Video Note  ? ?Paul Newton, connected with  Paul Newton  ([id number], 10-Oct-1980) on 12/01/21 at  6:45 PM EDT by a video-enabled telemedicine application and verified that I am speaking with the correct person using two identifiers. ? ?Location: ?Patient: Virtual Visit Location Patient:  Home ?Provider: Virtual Visit Location Provider: Home Office ?  ?I discussed the limitations of evaluation and management by telemedicine and the availability of in person appointments. The patient expressed understanding and agreed to proceed.   ? ?History of Present Illness: ?Paul Newton is a 41 y.o. who identifies as a male who was assigned male at birth, and is being seen today with complaints of nasal congestion and a productive cough for the past week.  ? ?He has been using Mucinex OTC without relief.  ?Feels his cough is now getting worse over the past few days.  ? ?Started in his head went to chest and now back into his sinuses.  ? ?He denies a fever.  ?Denies a history of asthma  ? ? ?Problems:  ?Patient Active Problem List  ? Diagnosis Date Noted  ? Conductive hearing loss of left ear with unrestricted hearing of right ear 10/01/2020  ? Otorrhagia of left ear 10/01/2020  ? Arthralgia of left temporomandibular joint 03/01/2018  ? Acute non-recurrent sinusitis 05/27/2017  ? Eustachian tube dysfunction, left 09/02/2015  ?  ?Allergies:  ?Allergies  ?Allergen Reactions  ? Hyoscyamine   ?  Other reaction(s): Other (See Comments) ?Felt like he couldn't swallow.   ? ?Medications: No current outpatient medications on file. ? ?Observations/Objective: ?Patient is well-developed, well-nourished in no acute distress.  ?Resting comfortably  at home.  ?Head is normocephalic, atraumatic.  ?No labored breathing.  ?Speech is clear and coherent with logical content.  ?Patient is alert and  oriented at baseline.  ? ? ?Assessment and Plan: ?1. Acute non-recurrent pansinusitis ?Continue Mucinex for congestion and cough  ? ?- amoxicillin-clavulanate (AUGMENTIN) 875-125 MG tablet; Take 1 tablet by mouth 2 (two) times daily for 7 days. Take with food  Dispense: 14 tablet; Refill: 0 ?   ? ?Follow Up Instructions: ?I discussed the assessment and treatment plan with the patient. The patient was provided an opportunity to ask  questions and all were answered. The patient agreed with the plan and demonstrated an understanding of the instructions.  A copy of instructions were sent to the patient via MyChart unless otherwise noted below.  ? ? ?The patient was advised to call back or seek an in-person evaluation if the symptoms worsen or if the condition fails to improve as anticipated. ? ?Time:  ?I spent 10 minutes with the patient via telehealth technology discussing the above problems/concerns.   ? ?[name], FNP  ?

## 2021-12-03 ENCOUNTER — Ambulatory Visit: Payer: 59 | Admitting: Podiatry

## 2021-12-17 ENCOUNTER — Ambulatory Visit: Payer: 59 | Admitting: Podiatry

## 2021-12-17 ENCOUNTER — Encounter: Payer: Self-pay | Admitting: Podiatry

## 2021-12-17 DIAGNOSIS — M778 Other enthesopathies, not elsewhere classified: Secondary | ICD-10-CM | POA: Diagnosis not present

## 2021-12-17 MED ORDER — TRIAMCINOLONE ACETONIDE 40 MG/ML IJ SUSP
20.0000 mg | Freq: Once | INTRAMUSCULAR | Status: AC
  Administered 2021-12-17: 20 mg

## 2021-12-17 NOTE — Progress Notes (Signed)
He presents today for follow-up capsulitis second metatarsophalangeal joint of the right foot.  He states that it seems to be worse now than it was previously because he was unable to start his medication after leaving here.  States that by the time he picked it up and started to take it he had developed bronchitis or upper respiratory infection had to see his primary care provider and was put on antibiotics.  So he never did anything about his foot.  Objective: Vital signs are stable he is alert oriented x3 has severe pain with mild hammertoe deformity on range of motion of the second metatarsophalangeal joint of the right foot.  Physical exam essentially unchanged.  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  Plan: Discussed etiology pathology and surgical therapies reinjected periarticular today the second metatarsal phalangeal joint of the right foot with 10 mg Kenalog 5 mg Marcaine start him on methylprednisolone to be followed by meloxicam.  Once again discussed appropriate shoe gear stretching exercise ice therapy shoe gear modifications and stiff soles.  He understands this is amenable to it.

## 2022-01-21 ENCOUNTER — Ambulatory Visit: Payer: 59 | Admitting: Podiatry

## 2022-02-16 ENCOUNTER — Encounter (HOSPITAL_COMMUNITY): Payer: Self-pay | Admitting: Oncology

## 2022-02-16 ENCOUNTER — Emergency Department (HOSPITAL_COMMUNITY)
Admission: EM | Admit: 2022-02-16 | Discharge: 2022-02-16 | Disposition: A | Discharge status: Home / Self Care | Payer: Self-pay | Attending: Emergency Medicine | Admitting: Emergency Medicine

## 2022-02-16 ENCOUNTER — Other Ambulatory Visit: Payer: Self-pay

## 2022-02-16 ENCOUNTER — Emergency Department (HOSPITAL_COMMUNITY): Payer: Self-pay

## 2022-02-16 DIAGNOSIS — R0789 Other chest pain: Secondary | ICD-10-CM | POA: Insufficient documentation

## 2022-02-16 DIAGNOSIS — F17210 Nicotine dependence, cigarettes, uncomplicated: Secondary | ICD-10-CM | POA: Diagnosis not present

## 2022-02-16 LAB — TROPONIN I (HIGH SENSITIVITY)
Troponin I (High Sensitivity): 4 ng/L (ref <18)
Troponin I (High Sensitivity): 5 ng/L (ref <18)

## 2022-02-16 LAB — CBC
HCT: 46.3 % (ref 39.0–52.0)
Hemoglobin: 15.8 g/dL (ref 13.0–17.0)
MCH: 32.6 pg (ref 26.0–34.0)
MCHC: 34.1 g/dL (ref 30.0–36.0)
MCV: 95.5 fL (ref 80.0–100.0)
Platelets: 253 10*3/uL (ref 150–400)
RBC: 4.85 MIL/uL (ref 4.22–5.81)
RDW: 13.5 % (ref 11.5–15.5)
WBC: 8.8 10*3/uL (ref 4.0–10.5)
nRBC: 0 % (ref 0.0–0.2)

## 2022-02-16 LAB — BASIC METABOLIC PANEL
Anion gap: 8 (ref 5–15)
BUN: 14 mg/dL (ref 6–20)
CO2: 23 mmol/L (ref 22–32)
Calcium: 8.8 mg/dL — ABNORMAL LOW (ref 8.9–10.3)
Chloride: 108 mmol/L (ref 98–111)
Creatinine, Ser: 0.88 mg/dL (ref 0.61–1.24)
GFR, Estimated: >60 mL/min (ref >60)
Glucose, Bld: 114 mg/dL — ABNORMAL HIGH (ref 70–99)
Potassium: 3.8 mmol/L (ref 3.5–5.1)
Sodium: 139 mmol/L (ref 135–145)

## 2022-02-16 MED ORDER — SUCRALFATE 1 G PO TABS: 1.0000 g | ORAL_TABLET | Freq: Three times a day (TID) | ORAL | 0 refills | Status: DC

## 2022-02-16 MED ORDER — SODIUM CHLORIDE 0.9 % IV BOLUS
1000.0000 mL | Freq: Once | INTRAVENOUS | Status: AC
  Administered 2022-02-16: 1000 mL via INTRAVENOUS

## 2022-02-16 MED ORDER — KETOROLAC TROMETHAMINE 15 MG/ML IJ SOLN
15.0000 mg | Freq: Once | INTRAMUSCULAR | Status: AC
  Administered 2022-02-16: 15 mg via INTRAVENOUS
  Filled 2022-02-16: qty 1

## 2022-02-16 NOTE — ED Triage Notes (Signed)
Pt reports substernal CP w/ radiation to left arm and left jaw. Pt also endorsing diaphoresis, fatigue, nausea, weakness and HA. Pt states pain began this morning on his way to work.

## 2022-02-16 NOTE — Discharge Instructions (Signed)
It was a pleasure caring for you today in the emergency department. ° °Please return to the emergency department for any worsening or worrisome symptoms. ° ° °

## 2022-02-16 NOTE — ED Provider Notes (Signed)
[facility]-EMERGENCY DEPT Provider Note   CSN: [id number] Arrival date & time: 02/16/22  1101     History  Chief Complaint  Patient presents with   Chest Pain    Paul Newton is a 41 y.o. male.  Patient as above with significant medical history as below, including lymphadenectomy who presents to the ED with complaint of chest pain.  Patient reports around 9 AM this morning he was at [employer] shopping when he began to experience pain to his left side chest wall rating to his left arm.  It is sharp, stabbing.  He had some mild nausea which resolved, felt mild diaphoresis which also resolved spontaneously.  Symptoms are greatly improved at this time. No history of this in the past.  No history of DM, HTN or HLD, no fracture relatives with MI prior to age 57.  Symptoms resolved without intervention.  No stimulant use.  He does smoke cigarettes.  Normal state of health prior to onset of symptoms.  No fevers, chills, vomiting, change in bowel or bladder function, change to p.o. intake, no rashes, no trauma     Past Medical History:  Diagnosis Date   Pneumonia     Past Surgical History:  Procedure Laterality Date   LYMPHADENECTOMY     Right neck R/T swelling with airway compromise during bout with pneumonia     The history is provided by the patient. No language interpreter was used.  Chest Pain Associated symptoms: diaphoresis and nausea   Associated symptoms: no abdominal pain, no cough, no dysphagia, no fever, no headache, no palpitations, no shortness of breath and no vomiting        Home Medications Prior to Admission medications   Medication Sig Start Date End Date Taking? Authorizing Provider  sucralfate (CARAFATE) 1 g tablet Take 1 tablet (1 g total) by mouth 4 (four) times daily -  with meals and at bedtime for 7 days. 02/16/22 02/23/22 Yes Sloan Leiter, DO  dicyclomine (BENTYL) 20 MG tablet Take 1 tablet (20 mg total) by mouth 2 (two) times  daily. 02/14/12 07/06/12  Chilton Si, PA-C      Allergies    Hyoscyamine    Review of Systems   Review of Systems  Constitutional:  Positive for diaphoresis. Negative for chills and fever.  HENT:  Negative for facial swelling and trouble swallowing.   Eyes:  Negative for photophobia and visual disturbance.  Respiratory:  Negative for cough and shortness of breath.   Cardiovascular:  Positive for chest pain. Negative for palpitations.  Gastrointestinal:  Positive for nausea. Negative for abdominal pain and vomiting.  Endocrine: Negative for polydipsia and polyuria.  Genitourinary:  Negative for difficulty urinating and hematuria.  Musculoskeletal:  Positive for arthralgias. Negative for gait problem and joint swelling.  Skin:  Negative for pallor and rash.  Neurological:  Negative for syncope and headaches.  Psychiatric/Behavioral:  Negative for agitation and confusion.     Physical Exam Updated Vital Signs BP (!) 96/49   Pulse 61   Temp 98.4 F (36.9 C) (Oral)   Resp 17   Ht 5\' 6"  (1.676 m)   Wt 90.7 kg   SpO2 99%   BMI 32.28 kg/m  Physical Exam Vitals and nursing note reviewed.  Constitutional:      General: He is not in acute distress.    Appearance: He is well-developed. He is not ill-appearing, toxic-appearing or diaphoretic.  HENT:     Head: Normocephalic and  atraumatic. No right periorbital erythema or left periorbital erythema.     Right Ear: External ear normal.     Left Ear: External ear normal.     Mouth/Throat:     Mouth: Mucous membranes are moist.  Eyes:     General: No scleral icterus. Cardiovascular:     Rate and Rhythm: Normal rate and regular rhythm.     Pulses: Normal pulses.          Radial pulses are 2+ on the right side and 2+ on the left side.       Dorsalis pedis pulses are 2+ on the right side and 2+ on the left side.     Heart sounds: Normal heart sounds. No murmur heard.    No S3 or S4 sounds.  Pulmonary:     Effort: Pulmonary  effort is normal. No respiratory distress.     Breath sounds: Normal breath sounds.  Abdominal:     General: Abdomen is flat.     Palpations: Abdomen is soft.     Tenderness: There is no abdominal tenderness.  Musculoskeletal:        General: Normal range of motion.     Cervical back: Normal range of motion.     Right lower leg: No edema.     Left lower leg: No edema.  Skin:    General: Skin is warm and dry.     Capillary Refill: Capillary refill takes less than 2 seconds.  Neurological:     Mental Status: He is alert and oriented to person, place, and time.  Psychiatric:        Mood and Affect: Mood normal.        Behavior: Behavior normal.     ED Results / Procedures / Treatments   Labs (all labs ordered are listed, but only abnormal results are displayed) Labs Reviewed  BASIC METABOLIC PANEL - Abnormal; Notable for the following components:      Result Value   Glucose, Bld 114 (*)    Calcium 8.8 (*)    All other components within normal limits  CBC  TROPONIN I (HIGH SENSITIVITY)  TROPONIN I (HIGH SENSITIVITY)    EKG EKG Interpretation  Date/Time:  Monday February 16 2022 11:04:28 EDT Ventricular Rate:  64 PR Interval:  140 QRS Duration: 92 QT Interval:  378 QTC Calculation: 389 R Axis:   71 Text Interpretation: Normal sinus rhythm Normal ECG When compared with ECG of 30-Apr-2021 16:38, PREVIOUS ECG IS PRESENT No significant change was found Confirmed by Mancel Bale [phone]) on 02/16/2022 1:18:40 PM  Radiology DG Chest 2 View  Result Date: 02/16/2022 CLINICAL DATA:  Chest pain EXAM: CHEST - 2 VIEW COMPARISON:  04/30/2021 chest radiograph. FINDINGS: Stable cardiomediastinal silhouette with normal heart size. No pneumothorax. No pleural effusion. Lungs appear clear, with no acute consolidative airspace disease and no pulmonary edema. IMPRESSION: No active cardiopulmonary disease. Electronically Signed   By: Delbert Phenix M.D.   On: 02/16/2022 11:26     Procedures Procedures    Medications Ordered in ED Medications  ketorolac (TORADOL) 15 MG/ML injection 15 mg (15 mg Intravenous Given 02/16/22 1653)  sodium chloride 0.9 % bolus 1,000 mL (0 mLs Intravenous Stopped 02/16/22 1850)    ED Course/ Medical Decision Making/ A&P                           Medical Decision Making Amount and/or Complexity of Data Reviewed Labs: ordered.  Radiology: ordered.  Risk Prescription drug management.    CC: cp  This patient presents to the Emergency Department for the above complaint. This involves an extensive number of treatment options and is a complaint that carries with it a high risk of complications and morbidity. Vital signs were reviewed. Serious etiologies considered.  Differential includes all life-threatening causes for chest pain. This includes but is not exclusive to acute coronary syndrome, aortic dissection, pulmonary embolism, cardiac tamponade, community-acquired pneumonia, pericarditis, musculoskeletal chest wall pain, etc.  Record review:  Previous records obtained and reviewed prior ED visits, prior labs and imaging, prior urgent care visit  Additional history obtained from spouse  Medical and surgical history as noted above.   Work up as above, notable for:  Labs & imaging results that were available during my care of the patient were visualized by me and considered in my medical decision making.  Physical exam as above.   I ordered imaging studies which included chest x-ray. I visualized the imaging, interpreted images, and I agree with radiologist interpretation.  No acute process  Cardiac monitoring reviewed and interpreted personally which shows NSR  Management: IV fluids, Toradol No current chest pain, no need for aspirin at this time  ED Course:     Reassessment:  Symptoms resolved,  Admission was considered.    The patient's chest pain is not suggestive of pulmonary embolus, cardiac ischemia,  aortic dissection, pericarditis, myocarditis, pulmonary embolism, pneumothorax, pneumonia, Zoster, or esophageal perforation, or other serious etiology.  Historically not abrupt in onset, tearing or ripping, pulses symmetric. EKG nonspecific for ischemia/infarction. No dysrhythmias, brugada, WPW, prolonged QT noted.   Troponin negative x2. CXR reviewed. Labs without demonstration of acute pathology unless otherwise noted above. Low HEART Score: 0-3 points (0.9-1.7% risk of MACE).  Given the extremely low risk of these diagnoses further testing and evaluation for these possibilities does not appear to be indicated at this time. Patient in no distress and overall condition improved here in the ED. Detailed discussions were had with the patient regarding current findings, and need for close f/u with PCP or on call doctor. The patient has been instructed to return immediately if the symptoms worsen in any way for re-evaluation. Patient verbalized understanding and is in agreement with current care plan. All questions answered prior to discharge.             Social determinants of health include -  Social History   Socioeconomic History   Marital status: Single    Spouse name: Not on file   Number of children: Not on file   Years of education: Not on file   Highest education level: Not on file  Occupational History   Not on file  Tobacco Use   Smoking status: Every Day    Packs/day: 1.00    Types: Cigarettes   Smokeless tobacco: Never  Vaping Use   Vaping Use: Never used  Substance and Sexual Activity   Alcohol use: Yes    Comment: occasional   Drug use: Not Currently   Sexual activity: Yes  Other Topics Concern   Not on file  Social History Narrative   Not on file   Social Determinants of Health   Financial Resource Strain: Not on file  Food Insecurity: Not on file  Transportation Needs: Not on file  Physical Activity: Not on file  Stress: Not on file  Social  Connections: Not on file  Intimate Partner Violence: Not on file  This chart was dictated using voice recognition software.  Despite best efforts to proofread,  errors can occur which can change the documentation meaning.         Final Clinical Impression(s) / ED Diagnoses Final diagnoses:  Atypical chest pain    Rx / DC Orders ED Discharge Orders          Ordered    sucralfate (CARAFATE) 1 g tablet  3 times daily with meals & bedtime        02/16/22 1845              Sloan Leiter, DO 02/16/22 1852

## 2022-02-16 NOTE — ED Notes (Signed)
Urine sent to lab for holding.  

## 2022-03-07 ENCOUNTER — Emergency Department
Admission: EM | Admit: 2022-03-07 | Discharge: 2022-03-07 | Disposition: A | Discharge status: Home / Self Care | Payer: 59 | Attending: Emergency Medicine | Admitting: Emergency Medicine

## 2022-03-07 ENCOUNTER — Other Ambulatory Visit: Payer: Self-pay

## 2022-03-07 ENCOUNTER — Encounter: Payer: Self-pay | Admitting: Emergency Medicine

## 2022-03-07 ENCOUNTER — Emergency Department: Payer: 59

## 2022-03-07 DIAGNOSIS — Y906 Blood alcohol level of 120-199 mg/100 ml: Secondary | ICD-10-CM | POA: Insufficient documentation

## 2022-03-07 DIAGNOSIS — F10920 Alcohol use, unspecified with intoxication, uncomplicated: Secondary | ICD-10-CM | POA: Insufficient documentation

## 2022-03-07 DIAGNOSIS — W500XXA Accidental hit or strike by another person, initial encounter: Secondary | ICD-10-CM | POA: Insufficient documentation

## 2022-03-07 DIAGNOSIS — S6291XA Unspecified fracture of right wrist and hand, initial encounter for closed fracture: Secondary | ICD-10-CM

## 2022-03-07 DIAGNOSIS — S62304A Unspecified fracture of fourth metacarpal bone, right hand, initial encounter for closed fracture: Secondary | ICD-10-CM | POA: Diagnosis not present

## 2022-03-07 DIAGNOSIS — M2392 Unspecified internal derangement of left knee: Secondary | ICD-10-CM | POA: Insufficient documentation

## 2022-03-07 DIAGNOSIS — S6991XA Unspecified injury of right wrist, hand and finger(s), initial encounter: Secondary | ICD-10-CM | POA: Diagnosis not present

## 2022-03-07 DIAGNOSIS — R69 Illness, unspecified: Secondary | ICD-10-CM | POA: Diagnosis not present

## 2022-03-07 LAB — CBC WITH DIFFERENTIAL/PLATELET
Abs Immature Granulocytes: 0.13 10*3/uL — ABNORMAL HIGH (ref 0.00–0.07)
Basophils Absolute: 0.1 10*3/uL (ref 0.0–0.1)
Basophils Relative: 0 %
Eosinophils Absolute: 0 10*3/uL (ref 0.0–0.5)
Eosinophils Relative: 0 %
HCT: 45.3 % (ref 39.0–52.0)
Hemoglobin: 15.3 g/dL (ref 13.0–17.0)
Immature Granulocytes: 1 %
Lymphocytes Relative: 12 %
Lymphs Abs: 2.6 10*3/uL (ref 0.7–4.0)
MCH: 31.7 pg (ref 26.0–34.0)
MCHC: 33.8 g/dL (ref 30.0–36.0)
MCV: 93.8 fL (ref 80.0–100.0)
Monocytes Absolute: 1.6 10*3/uL — ABNORMAL HIGH (ref 0.1–1.0)
Monocytes Relative: 7 %
Neutro Abs: 17.8 10*3/uL — ABNORMAL HIGH (ref 1.7–7.7)
Neutrophils Relative %: 80 %
Platelets: 270 10*3/uL (ref 150–400)
RBC: 4.83 MIL/uL (ref 4.22–5.81)
RDW: 13.2 % (ref 11.5–15.5)
WBC: 22.2 10*3/uL — ABNORMAL HIGH (ref 4.0–10.5)
nRBC: 0 % (ref 0.0–0.2)

## 2022-03-07 LAB — ETHANOL: Alcohol, Ethyl (B): 172 mg/dL — ABNORMAL HIGH (ref <10)

## 2022-03-07 LAB — COMPREHENSIVE METABOLIC PANEL
ALT: 22 U/L (ref 0–44)
AST: 31 U/L (ref 15–41)
Albumin: 4.4 g/dL (ref 3.5–5.0)
Alkaline Phosphatase: 66 U/L (ref 38–126)
Anion gap: 9 (ref 5–15)
BUN: 12 mg/dL (ref 6–20)
CO2: 21 mmol/L — ABNORMAL LOW (ref 22–32)
Calcium: 8.6 mg/dL — ABNORMAL LOW (ref 8.9–10.3)
Chloride: 111 mmol/L (ref 98–111)
Creatinine, Ser: 1.01 mg/dL (ref 0.61–1.24)
GFR, Estimated: >60 mL/min (ref >60)
Glucose, Bld: 106 mg/dL — ABNORMAL HIGH (ref 70–99)
Potassium: 3.6 mmol/L (ref 3.5–5.1)
Sodium: 141 mmol/L (ref 135–145)
Total Bilirubin: 0.6 mg/dL (ref 0.3–1.2)
Total Protein: 7.9 g/dL (ref 6.5–8.1)

## 2022-03-07 LAB — LIPASE, BLOOD: Lipase: 35 U/L (ref 11–51)

## 2022-03-07 MED ORDER — MELOXICAM 15 MG PO TABS: 15.0000 mg | ORAL_TABLET | Freq: Every day | ORAL | 2 refills | Status: DC

## 2022-03-07 MED ORDER — MORPHINE SULFATE (PF) 4 MG/ML IV SOLN
4.0000 mg | Freq: Once | INTRAVENOUS | Status: AC
  Administered 2022-03-07: 4 mg via INTRAVENOUS
  Filled 2022-03-07: qty 1

## 2022-03-07 MED ORDER — ONDANSETRON HCL 4 MG/2ML IJ SOLN
INTRAMUSCULAR | Status: AC
  Administered 2022-03-07: 4 mg via INTRAVENOUS
  Filled 2022-03-07: qty 2

## 2022-03-07 MED ORDER — OXYCODONE-ACETAMINOPHEN 5-325 MG PO TABS: 1.0000 | ORAL_TABLET | ORAL | 0 refills | Status: DC | PRN

## 2022-03-07 MED ORDER — ONDANSETRON HCL 4 MG/2ML IJ SOLN: 4.0000 mg | Freq: Once | INTRAMUSCULAR | Status: DC

## 2022-03-07 MED ORDER — KETOROLAC TROMETHAMINE 30 MG/ML IJ SOLN
30.0000 mg | Freq: Once | INTRAMUSCULAR | Status: AC
  Administered 2022-03-07: 30 mg via INTRAVENOUS
  Filled 2022-03-07: qty 1

## 2022-03-07 MED ORDER — SODIUM CHLORIDE 0.9 % IV BOLUS
1000.0000 mL | Freq: Once | INTRAVENOUS | Status: AC
  Administered 2022-03-07: 1000 mL via INTRAVENOUS

## 2022-03-07 NOTE — Discharge Instructions (Signed)
Follow-up with [facility] orthopedics.  Please call on Monday for an appointment.  Continue to apply ice to the left knee and right hand.  As the swelling decreases he may be able to extend his knee better.  Wear the knee immobilizer is much as possible.  Leave the hand splint on until seen by orthopedics.  He has been given 2 medications.  Meloxicam is once daily and is for pain.  May also take Tylenol.  Be very sparing with the narcotic pain medication and you should wait to take this for several hours due to his alcohol level.  Return if worsening
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
## 2022-03-07 NOTE — ED Notes (Signed)
Accidental documentation of iv removal for this pt.
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
## 2022-03-07 NOTE — ED Notes (Signed)
Patient's wife declined discharge vital signs.

## 2022-03-09 DIAGNOSIS — S83282A Other tear of lateral meniscus, current injury, left knee, initial encounter: Secondary | ICD-10-CM | POA: Diagnosis not present

## 2022-03-09 DIAGNOSIS — S62306A Unspecified fracture of fifth metacarpal bone, right hand, initial encounter for closed fracture: Secondary | ICD-10-CM | POA: Diagnosis not present

## 2022-03-09 DIAGNOSIS — S62304A Unspecified fracture of fourth metacarpal bone, right hand, initial encounter for closed fracture: Secondary | ICD-10-CM | POA: Diagnosis not present

## 2022-03-10 ENCOUNTER — Other Ambulatory Visit: Payer: Self-pay | Admitting: Orthopedic Surgery

## 2022-03-10 DIAGNOSIS — S62314A Displaced fracture of base of fourth metacarpal bone, right hand, initial encounter for closed fracture: Secondary | ICD-10-CM | POA: Diagnosis not present

## 2022-03-14 DIAGNOSIS — M25562 Pain in left knee: Secondary | ICD-10-CM | POA: Diagnosis not present

## 2022-03-17 ENCOUNTER — Other Ambulatory Visit: Payer: Self-pay

## 2022-03-17 ENCOUNTER — Encounter (HOSPITAL_COMMUNITY): Payer: Self-pay | Admitting: Orthopedic Surgery

## 2022-03-17 NOTE — Progress Notes (Signed)
Spoke with pt for pre-op call. Pt denies cardiac history, Diabetes or HTN.   Shower instructions given to pt and he voiced understanding.

## 2022-03-18 ENCOUNTER — Encounter (HOSPITAL_COMMUNITY)
Admission: RE | Disposition: A | Discharge status: Home / Self Care | Payer: Self-pay | Source: Home / Self Care | Attending: Orthopedic Surgery

## 2022-03-18 ENCOUNTER — Ambulatory Visit (HOSPITAL_BASED_OUTPATIENT_CLINIC_OR_DEPARTMENT_OTHER): Payer: 59 | Admitting: Anesthesiology

## 2022-03-18 ENCOUNTER — Other Ambulatory Visit: Payer: Self-pay

## 2022-03-18 ENCOUNTER — Ambulatory Visit (HOSPITAL_COMMUNITY)
Admission: RE | Admit: 2022-03-18 | Discharge: 2022-03-18 | Disposition: A | Discharge status: Home / Self Care | Payer: 59 | Attending: Orthopedic Surgery | Admitting: Orthopedic Surgery

## 2022-03-18 ENCOUNTER — Ambulatory Visit (HOSPITAL_COMMUNITY): Payer: 59 | Admitting: Anesthesiology

## 2022-03-18 ENCOUNTER — Encounter (HOSPITAL_COMMUNITY): Payer: Self-pay | Admitting: Orthopedic Surgery

## 2022-03-18 ENCOUNTER — Ambulatory Visit (HOSPITAL_COMMUNITY): Payer: 59

## 2022-03-18 DIAGNOSIS — F17210 Nicotine dependence, cigarettes, uncomplicated: Secondary | ICD-10-CM | POA: Diagnosis not present

## 2022-03-18 DIAGNOSIS — X58XXXA Exposure to other specified factors, initial encounter: Secondary | ICD-10-CM | POA: Diagnosis not present

## 2022-03-18 DIAGNOSIS — S62606A Fracture of unspecified phalanx of right little finger, initial encounter for closed fracture: Secondary | ICD-10-CM | POA: Diagnosis not present

## 2022-03-18 DIAGNOSIS — S62304A Unspecified fracture of fourth metacarpal bone, right hand, initial encounter for closed fracture: Secondary | ICD-10-CM

## 2022-03-18 DIAGNOSIS — R69 Illness, unspecified: Secondary | ICD-10-CM | POA: Diagnosis not present

## 2022-03-18 DIAGNOSIS — S62604A Fracture of unspecified phalanx of right ring finger, initial encounter for closed fracture: Secondary | ICD-10-CM | POA: Diagnosis not present

## 2022-03-18 DIAGNOSIS — S62316A Displaced fracture of base of fifth metacarpal bone, right hand, initial encounter for closed fracture: Secondary | ICD-10-CM | POA: Diagnosis not present

## 2022-03-18 DIAGNOSIS — S62314A Displaced fracture of base of fourth metacarpal bone, right hand, initial encounter for closed fracture: Secondary | ICD-10-CM | POA: Diagnosis not present

## 2022-03-18 DIAGNOSIS — S62306A Unspecified fracture of fifth metacarpal bone, right hand, initial encounter for closed fracture: Secondary | ICD-10-CM

## 2022-03-18 HISTORY — DX: Other complications of anesthesia, initial encounter: T88.59XA

## 2022-03-18 HISTORY — PX: CLOSED REDUCTION METACARPAL WITH PERCUTANEOUS PINNING: SHX5613

## 2022-03-18 HISTORY — DX: Gastro-esophageal reflux disease without esophagitis: K21.9

## 2022-03-18 SURGERY — CLOSED REDUCTION, FRACTURE, METACARPAL BONE, WITH PERCUTANEOUS PINNING
Anesthesia: Monitor Anesthesia Care | Site: Finger | Laterality: Right

## 2022-03-18 MED ORDER — PROPOFOL 500 MG/50ML IV EMUL
INTRAVENOUS | Status: DC | PRN
  Administered 2022-03-18: 100 ug/kg/min via INTRAVENOUS

## 2022-03-18 MED ORDER — BUPIVACAINE HCL (PF) 0.25 % IJ SOLN
INTRAMUSCULAR | Status: AC
  Filled 2022-03-18: qty 30

## 2022-03-18 MED ORDER — CHLORHEXIDINE GLUCONATE 0.12 % MT SOLN
15.0000 mL | Freq: Once | OROMUCOSAL | Status: AC
  Administered 2022-03-18: 15 mL via OROMUCOSAL
  Filled 2022-03-18: qty 15

## 2022-03-18 MED ORDER — DEXMEDETOMIDINE (PRECEDEX) IN NS 20 MCG/5ML (4 MCG/ML) IV SYRINGE
PREFILLED_SYRINGE | INTRAVENOUS | Status: DC | PRN
  Administered 2022-03-18: 20 ug via INTRAVENOUS

## 2022-03-18 MED ORDER — ACETAMINOPHEN 10 MG/ML IV SOLN: 1000.0000 mg | Freq: Once | INTRAVENOUS | Status: DC | PRN

## 2022-03-18 MED ORDER — FENTANYL CITRATE (PF) 100 MCG/2ML IJ SOLN
INTRAMUSCULAR | Status: AC
  Administered 2022-03-18: 100 ug via INTRAVENOUS
  Filled 2022-03-18: qty 2

## 2022-03-18 MED ORDER — ORAL CARE MOUTH RINSE
15.0000 mL | Freq: Once | OROMUCOSAL | Status: AC
Start: 2022-03-18 — End: 2022-03-18

## 2022-03-18 MED ORDER — OXYCODONE HCL 5 MG/5ML PO SOLN: 5.0000 mg | Freq: Once | ORAL | Status: DC | PRN

## 2022-03-18 MED ORDER — FENTANYL CITRATE (PF) 100 MCG/2ML IJ SOLN: 25.0000 ug | INTRAMUSCULAR | Status: DC | PRN

## 2022-03-18 MED ORDER — CEFAZOLIN SODIUM-DEXTROSE 2-4 GM/100ML-% IV SOLN
2.0000 g | INTRAVENOUS | Status: AC
  Administered 2022-03-18: 2 g via INTRAVENOUS
  Filled 2022-03-18: qty 100

## 2022-03-18 MED ORDER — OXYCODONE HCL 5 MG PO TABS: 5.0000 mg | ORAL_TABLET | Freq: Once | ORAL | Status: DC | PRN

## 2022-03-18 MED ORDER — TRAMADOL HCL 50 MG PO TABS: 50.0000 mg | ORAL_TABLET | Freq: Four times a day (QID) | ORAL | 0 refills | Status: DC | PRN

## 2022-03-18 MED ORDER — ACETAMINOPHEN 500 MG PO TABS: 1000.0000 mg | ORAL_TABLET | Freq: Once | ORAL | Status: DC | PRN

## 2022-03-18 MED ORDER — 0.9 % SODIUM CHLORIDE (POUR BTL) OPTIME
TOPICAL | Status: DC | PRN
  Administered 2022-03-18: 1000 mL

## 2022-03-18 MED ORDER — ACETAMINOPHEN 160 MG/5ML PO SOLN: 1000.0000 mg | Freq: Once | ORAL | Status: DC | PRN

## 2022-03-18 MED ORDER — MIDAZOLAM HCL 2 MG/2ML IJ SOLN: 2.0000 mg | Freq: Once | INTRAMUSCULAR | Status: AC

## 2022-03-18 MED ORDER — MIDAZOLAM HCL 2 MG/2ML IJ SOLN
INTRAMUSCULAR | Status: AC
  Administered 2022-03-18: 2 mg via INTRAVENOUS
  Filled 2022-03-18: qty 2

## 2022-03-18 MED ORDER — FENTANYL CITRATE (PF) 100 MCG/2ML IJ SOLN: 100.0000 ug | Freq: Once | INTRAMUSCULAR | Status: AC

## 2022-03-18 MED ORDER — BUPIVACAINE HCL 0.25 % IJ SOLN: INTRAMUSCULAR | Status: DC | PRN

## 2022-03-18 MED ORDER — LACTATED RINGERS IV SOLN: INTRAVENOUS | Status: DC

## 2022-03-18 SURGICAL SUPPLY — 43 items
BAG COUNTER SPONGE SURGICOUNT (BAG) ×1 IMPLANT
BAG SPNG CNTER NS LX DISP (BAG) ×1
BNDG CMPR 9X4 STRL LF SNTH (GAUZE/BANDAGES/DRESSINGS) ×1
BNDG CMPR STD VLCR NS LF 5.8X4 (GAUZE/BANDAGES/DRESSINGS) ×1
BNDG ELASTIC 4X5.8 VLCR NS LF (GAUZE/BANDAGES/DRESSINGS) IMPLANT
BNDG ELASTIC 4X5.8 VLCR STR LF (GAUZE/BANDAGES/DRESSINGS) ×1 IMPLANT
BNDG ESMARK 4X9 LF (GAUZE/BANDAGES/DRESSINGS) IMPLANT
BNDG GAUZE DERMACEA FLUFF 4 (GAUZE/BANDAGES/DRESSINGS) ×5 IMPLANT
BNDG GZE DERMACEA 4 6PLY (GAUZE/BANDAGES/DRESSINGS) ×1
CORD BIPOLAR FORCEPS 12FT (ELECTRODE) IMPLANT
COVER SURGICAL LIGHT HANDLE (MISCELLANEOUS) IMPLANT
CUFF TOURN SGL QUICK 18X4 (TOURNIQUET CUFF) ×1 IMPLANT
CUFF TOURN SGL QUICK 24 (TOURNIQUET CUFF)
CUFF TRNQT CYL 24X4X16.5-23 (TOURNIQUET CUFF) IMPLANT
DRAPE OEC MINIVIEW 54X84 (DRAPES) IMPLANT
DRAPE SURG 17X23 STRL (DRAPES) ×1 IMPLANT
GAUZE SPONGE 4X4 12PLY STRL (GAUZE/BANDAGES/DRESSINGS) ×3 IMPLANT
GAUZE XEROFORM 1X8 LF (GAUZE/BANDAGES/DRESSINGS) ×1 IMPLANT
GLOVE SURG SYN 8.0 (GLOVE) ×2 IMPLANT
GLOVE SURG SYN 8.0 PF PI (GLOVE) ×1 IMPLANT
GOWN STRL REUS W/ TWL LRG LVL3 (GOWN DISPOSABLE) ×1 IMPLANT
GOWN STRL REUS W/ TWL XL LVL3 (GOWN DISPOSABLE) ×1 IMPLANT
GOWN STRL REUS W/TWL LRG LVL3 (GOWN DISPOSABLE) ×1
GOWN STRL REUS W/TWL XL LVL3 (GOWN DISPOSABLE) ×1
K-WIRE DBL TROCAR .045X4 (WIRE) ×1
K-WIRE SURGICAL 1.6X102 (WIRE) IMPLANT
KIT BASIN OR (CUSTOM PROCEDURE TRAY) ×1 IMPLANT
KIT TURNOVER KIT B (KITS) ×1 IMPLANT
KWIRE DBL TROCAR .045X4 (WIRE) IMPLANT
NDL HYPO 25GX1X1/2 BEV (NEEDLE) IMPLANT
NEEDLE HYPO 25GX1X1/2 BEV (NEEDLE) IMPLANT
NS IRRIG 1000ML POUR BTL (IV SOLUTION) ×1 IMPLANT
PACK ORTHO EXTREMITY (CUSTOM PROCEDURE TRAY) ×1 IMPLANT
PAD ARMBOARD 7.5X6 YLW CONV (MISCELLANEOUS) ×2 IMPLANT
PAD CAST 4YDX4 CTTN HI CHSV (CAST SUPPLIES) ×2 IMPLANT
PADDING CAST COTTON 4X4 STRL (CAST SUPPLIES) ×1
SLEEVE SCD COMPRESS KNEE MED (STOCKING) IMPLANT
SLING ARM FOAM STRAP LRG (SOFTGOODS) IMPLANT
SLING ARM IMMOBILIZER LRG (SOFTGOODS) IMPLANT
SYR CONTROL 10ML LL (SYRINGE) ×1 IMPLANT
TOWEL GREEN STERILE (TOWEL DISPOSABLE) ×1 IMPLANT
TOWEL GREEN STERILE FF (TOWEL DISPOSABLE) ×1 IMPLANT
UNDERPAD 30X36 HEAVY ABSORB (UNDERPADS AND DIAPERS) ×1 IMPLANT

## 2022-03-18 NOTE — Op Note (Signed)
Patient was taken the operating suite and after induction of adequate regional anesthetic and IV sedation the right upper extremity was prepped and draped in the usual sterile fashion.  An Esmarch was used to exsanguinate the limb tourniquet was inflated 2 and 50 mmHg.  This point time under fluoroscopic imaging fractures at the bases of the ring and small metacarpals of the [facility] joint including a fracture of the dorsal aspect of the hamate were reduced with long tubular traction and downward pressure over the ring and small CMC joints.  These were then stabilized using 2.062 K wires driven from the ulnar border of the hand into the shaft of the small, ring, and long fingers.  We then placed a separate 0.045 K wire over the hamate fragment dorsally into the body of the hamate to secure fixation.  Fluoroscopic imaging of the right hand and wrist and multiple views revealed reduction of the CMC joints and of the hamate dorsal fragment.  K wire was then cut outside the skin and bent upon themselves.  They were then dressed with Xeroform, 4 x 4's, and an ulnar gutter splint.  The patient tolerated this procedure well went to the cover him in stable fashion.

## 2022-03-18 NOTE — Brief Op Note (Signed)
03/18/2022  3:04 PM  PATIENT:  Paul Newton  41 y.o. male  PRE-OPERATIVE DIAGNOSIS:  right ring finger and right small finger metacarpal fracture  POST-OPERATIVE DIAGNOSIS:  right ring finger and right small finger metacarpal fracture  PROCEDURE:  Procedure(s) with comments: closed reduction percutaneous pinning right ring finger and right small finger metacarpal fracture (Right) - Axillary block/ mac  SURGEON:  Surgeon(s) and Role:    Charlotte Crumb, MD - Primary  PHYSICIAN ASSISTANT:   ASSISTANTS: none   ANESTHESIA:   regional  EBL: None  BLOOD ADMINISTERED:none  DRAINS: none   LOCAL MEDICATIONS USED:  NONE  SPECIMEN:  No Specimen  DISPOSITION OF SPECIMEN:  N/A  COUNTS:  YES  TOURNIQUET:   Total Tourniquet Time Documented: Forearm (Right) - 13 minutes Total: Forearm (Right) - 13 minutes   DICTATION: .Dragon Dictation  PLAN OF CARE: Discharge to home after PACU  PATIENT DISPOSITION:  PACU - hemodynamically stable.   Delay start of Pharmacological VTE agent (>24hrs) due to surgical blood loss or risk of bleeding: not applicable

## 2022-03-18 NOTE — H&P (Signed)
Paul Newton is an 41 y.o. male.   Chief Complaint: Right hand pain and swelling HPI: Patient is a very pleasant 41 year old male status post right hand trauma with right ring and right small CMC fracture dislocations  Past Medical History:  Diagnosis Date   Complication of anesthesia    slow to wake up   COVID 2020   mild case   GERD (gastroesophageal reflux disease)    Pneumonia     Past Surgical History:  Procedure Laterality Date   LYMPHADENECTOMY     Right neck R/T swelling with airway compromise during bout with pneumonia    Family History  Problem Relation Age of Onset   Arthritis Mother    Social History:  reports that he has been smoking cigarettes. He has been smoking an average of .25 packs per day. He has never used smokeless tobacco. He reports that he does not currently use alcohol. He reports that he does not currently use drugs.  Allergies:  Allergies  Allergen Reactions   Hyoscyamine Other (See Comments)    Felt like he couldn't swallow- made his throat unusually "dry"     Medications Prior to Admission  Medication Sig Dispense Refill   acetaminophen (TYLENOL) 500 MG tablet Take 500 mg by mouth every 6 (six) hours as needed for moderate pain.     ibuprofen (ADVIL) 200 MG tablet Take 400 mg by mouth every 6 (six) hours as needed for moderate pain.     oxyCODONE-acetaminophen (PERCOCET) 5-325 MG tablet Take 1 tablet by mouth every 4 (four) hours as needed for severe pain. (Patient taking differently: Take 0.5 tablets by mouth every 4 (four) hours as needed for severe pain.) 20 tablet 0   meloxicam (MOBIC) 15 MG tablet Take 1 tablet (15 mg total) by mouth daily. (Patient not taking: Reported on 03/13/2022) 30 tablet 2   sucralfate (CARAFATE) 1 g tablet Take 1 tablet (1 g total) by mouth 4 (four) times daily -  with meals and at bedtime for 7 days. (Patient not taking: Reported on 03/13/2022) 28 tablet 0    No results found for this or any previous visit (from  the past 48 hour(s)). No results found.  Review of Systems  All other systems reviewed and are negative.   There were no vitals taken for this visit. Physical Exam Constitutional:      Appearance: Normal appearance.  HENT:     Head: Normocephalic and atraumatic.  Eyes:     Pupils: Pupils are equal, round, and reactive to light.  Cardiovascular:     Rate and Rhythm: Normal rate.  Pulmonary:     Effort: Pulmonary effort is normal.  Musculoskeletal:     Right hand: Swelling, deformity and bony tenderness present. Decreased range of motion.     Cervical back: Normal range of motion.     Comments: Right ring and right small CMC fracture dislocations with pain, swelling, decreased range of motion  Skin:    General: Skin is warm.  Neurological:     General: No focal deficit present.     Mental Status: He is alert and oriented to person, place, and time.     Cranial Nerves: No cranial nerve deficit.  Psychiatric:        Mood and Affect: Mood normal.        Behavior: Behavior normal.        Thought Content: Thought content normal.        Judgment: Judgment normal.  Assessment/Plan 41 year old male with right hand ring and small CMC fracture dislocations.  I discussed the role of closed duction pinning of these unstable fracture dislocations in the right hand.  Patient understands risks and benefits and wishes to proceed as an outpatient.  Marlowe Shores, MD 03/18/2022, 11:50 AM

## 2022-03-18 NOTE — Discharge Instructions (Signed)
Call MD / Call 911  If you experience chest pain or shortness of breath, CALL 911 and be transported to the hospital emergency room.  If you develope a fever above 101 F, pus (white drainage) or increased drainage or redness at the wound, or calf pain, call your surgeon's office. Constipation Prevention  Drink plenty of fluids.  Prune juice may be helpful.  You may use a stool softener, such as Colace (over the counter) 100 mg twice a day.  Use MiraLax (over the counter) for constipation as needed. Discharge instructions  Hand Center Instructions  Hand Surgery   Wound Care:  Keep your hand elevated above the level of your heart.  Do not allow it to dangle by your side.  Keep the dressing dry and do not remove it unless your doctor advises you to do so.  He will usually change it at the time of your post-op visit.  Moving your fingers is advised to stimulate circulation but will depend on the site of your surgery.  If you have a splint applied, your doctor will advise you regarding movement.   Activity:  Do not drive or operate machinery today.  Rest today and then you may return to your normal activity and work as indicated by your physician.   Diet:  Drink liquids today or eat a light diet.  You may resume a regular diet tomorrow.     General expectations:  Pain for two to three days.  Fingers may become slightly swollen.   Call your doctor if any of the following occur:  Severe pain not relieved by pain medication.  Elevated temperature.  Dressing soaked with blood.  Inability to move fingers.  White or bluish color to fingers. Increase activity slowly as tolerated  Post-operative opioid taper instructions:  POST-OPERATIVE OPIOID TAPER INSTRUCTIONS:  It is important to wean off of your opioid medication as soon as possible. If you do not need pain medication after your surgery it is ok to stop day one.  Opioids include:  Codeine, Hydrocodone(Norco, Vicodin), Oxycodone(Percocet,  oxycontin) and hydromorphone amongst others.  Long term and even short term use of opiods can cause:  Increased pain response  Dependence  Constipation  Depression  Respiratory depression  And more.  Withdrawal symptoms can include  Flu like symptoms  Nausea, vomiting  And more  Techniques to manage these symptoms  Hydrate well  Eat regular healthy meals  Stay active  Use relaxation techniques(deep breathing, meditating, yoga)  Do Not substitute Alcohol to help with tapering  If you have been on opioids for less than two weeks and do not have pain than it is ok to stop all together.  Plan to wean off of opioids  This plan should start within one week post op of your joint replacement.  Maintain the same interval or time between taking each dose and first decrease the dose.  Cut the total daily intake of opioids by one tablet each day  Next start to increase the time between doses.  The last dose that should be eliminated is the evening dose.

## 2022-03-18 NOTE — Transfer of Care (Signed)
Immediate Anesthesia Transfer of Care Note  Patient: Paul Newton  Procedure(s) Performed: closed reduction percutaneous pinning right ring finger and right small finger metacarpal fracture (Right: Finger)  Patient Location: PACU  Anesthesia Type:MAC  Level of Consciousness: drowsy  Airway & Oxygen Therapy: Patient Spontanous Breathing and Patient connected to nasal cannula oxygen  Post-op Assessment: Report given to RN and Post -op Vital signs reviewed and stable  Post vital signs: Reviewed and stable  Last Vitals:  Vitals Value Taken Time  BP 116/69 03/18/22 1511  Temp    Pulse 73 03/18/22 1515  Resp 15 03/18/22 1515  SpO2 98 % 03/18/22 1515  Vitals shown include unvalidated device data.  Last Pain:  Vitals:   03/18/22 1405  TempSrc:   PainSc: 0-No pain         Complications: No notable events documented.

## 2022-03-19 ENCOUNTER — Encounter (HOSPITAL_COMMUNITY): Payer: Self-pay | Admitting: Orthopedic Surgery

## 2022-03-24 DIAGNOSIS — M25562 Pain in left knee: Secondary | ICD-10-CM | POA: Diagnosis not present

## 2022-03-24 DIAGNOSIS — S62314A Displaced fracture of base of fourth metacarpal bone, right hand, initial encounter for closed fracture: Secondary | ICD-10-CM | POA: Diagnosis not present

## 2022-03-24 DIAGNOSIS — M25641 Stiffness of right hand, not elsewhere classified: Secondary | ICD-10-CM | POA: Diagnosis not present

## 2022-03-26 ENCOUNTER — Encounter (HOSPITAL_COMMUNITY): Payer: Self-pay | Admitting: Orthopedic Surgery

## 2022-03-26 MED ORDER — BUPIVACAINE-EPINEPHRINE (PF) 0.5% -1:200000 IJ SOLN
INTRAMUSCULAR | Status: DC | PRN
  Administered 2022-03-18: 25 mL via PERINEURAL

## 2022-03-26 NOTE — Anesthesia Postprocedure Evaluation (Signed)
Anesthesia Post Note  Patient: Paul Newton  Procedure(s) Performed: closed reduction percutaneous pinning right ring finger and right small finger metacarpal fracture (Right: Finger)     Patient location during evaluation: PACU Anesthesia Type: MAC and Regional Level of consciousness: awake and alert Pain management: pain level controlled Vital Signs Assessment: post-procedure vital signs reviewed and stable Respiratory status: spontaneous breathing, nonlabored ventilation and respiratory function stable Cardiovascular status: stable and blood pressure returned to baseline Postop Assessment: no apparent nausea or vomiting Anesthetic complications: no   No notable events documented.  Last Vitals:  Vitals:   03/18/22 1530 03/18/22 1545  BP: 138/71 124/70  Pulse: 65 (!) 58  Resp: 20 10  Temp:    SpO2: 95% 97%    Last Pain:  Vitals:   03/18/22 1545  TempSrc:   PainSc: 0-No pain                 Fallynn Gravett

## 2022-03-26 NOTE — Anesthesia Procedure Notes (Addendum)
Anesthesia Regional Block: Supraclavicular block   Pre-Anesthetic Checklist: , timeout performed,  Correct Patient, Correct Site, Correct Laterality,  Correct Procedure, Correct Position, site marked,  Risks and benefits discussed,  Surgical consent,  Pre-op evaluation,  At surgeon's request and post-op pain management  Laterality: Right and Upper  Prep: chloraprep       Needles:  Injection technique: Single-shot      Needle Length: 5cm  Needle Gauge: 22     Additional Needles: Arrow StimuQuik ECHO Echogenic Stimulating PNB Needle  Procedures:,,,, ultrasound used (permanent image in chart),,    Narrative:  Start time: 03/18/2022 1:58 PM End time: 03/18/2022 2:05 PM Injection made incrementally with aspirations every 5 mL.  Performed by: Personally  Anesthesiologist: Val Eagle, MD

## 2022-03-26 NOTE — Anesthesia Preprocedure Evaluation (Signed)
Anesthesia Evaluation  Patient identified by MRN, date of birth, ID band Patient awake    Reviewed: Allergy & Precautions, NPO status , Patient's Chart, lab work & pertinent test results  History of Anesthesia Complications Negative for: history of anesthetic complications  Airway Mallampati: II  TM Distance: >3 FB Neck ROM: Full    Dental  (+) Dental Advisory Given   Pulmonary neg shortness of breath, neg sleep apnea, neg COPD, neg recent URI, Current Smoker and Patient abstained from smoking.,    breath sounds clear to auscultation       Cardiovascular negative cardio ROS   Rhythm:Regular     Neuro/Psych negative neurological ROS  negative psych ROS   GI/Hepatic Neg liver ROS,   Endo/Other  negative endocrine ROS  Renal/GU negative Renal ROS     Musculoskeletal right ring finger and right small finger metacarpal fracture   Abdominal   Peds  Hematology negative hematology ROS (+) Lab Results      Component                Value               Date                      WBC                      22.2 (H)            03/07/2022                HGB                      15.3                03/07/2022                HCT                      45.3                03/07/2022                MCV                      93.8                03/07/2022                PLT                      270                 03/07/2022              Anesthesia Other Findings   Reproductive/Obstetrics                             Anesthesia Physical Anesthesia Plan  ASA: 2  Anesthesia Plan:    Post-op Pain Management:    Induction:   PONV Risk Score and Plan:   Airway Management Planned:   Additional Equipment:   Intra-op Plan:   Post-operative Plan:   Informed Consent:   Plan Discussed with:   Anesthesia Plan Comments:         Anesthesia Quick Evaluation

## 2022-04-09 DIAGNOSIS — S62314A Displaced fracture of base of fourth metacarpal bone, right hand, initial encounter for closed fracture: Secondary | ICD-10-CM | POA: Diagnosis not present

## 2022-04-30 DIAGNOSIS — S62314A Displaced fracture of base of fourth metacarpal bone, right hand, initial encounter for closed fracture: Secondary | ICD-10-CM | POA: Diagnosis not present
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
## 2022-07-21 ENCOUNTER — Telehealth: Payer: Self-pay | Admitting: Urgent Care

## 2022-07-21 DIAGNOSIS — L42 Pityriasis rosea: Secondary | ICD-10-CM

## 2022-07-21 MED ORDER — PREDNISONE 10 MG PO TABS: ORAL_TABLET | ORAL | 0 refills | Status: AC

## 2022-07-21 MED ORDER — TRIAMCINOLONE ACETONIDE 0.5 % EX CREA
1.0000 | TOPICAL_CREAM | Freq: Two times a day (BID) | CUTANEOUS | 0 refills | Status: DC
Start: 2022-07-21 — End: 2022-10-08

## 2022-07-21 NOTE — Patient Instructions (Signed)
Helane Rima Espe, thank you for joining [name], PA for today's virtual visit.  While this provider is not your primary care provider (PCP), if your PCP is located in our provider database this encounter information will be shared with them immediately following your visit.   [facility] account gives you access to today's visit and all your visits, tests, and labs performed at [facility] " click here if you don't have a [facility] account or go to [email or web address]  Consent: (Patient) Paul Newton provided verbal consent for this virtual visit at the beginning of the encounter.  Current Medications:  Current Outpatient Medications:    predniSONE (DELTASONE) 10 MG tablet, Take 6 tablets (60 mg total) by mouth daily with breakfast for 2 days, THEN 5 tablets (50 mg total) daily with breakfast for 2 days, THEN 4 tablets (40 mg total) daily with breakfast for 2 days, THEN 3 tablets (30 mg total) daily with breakfast for 2 days, THEN 2 tablets (20 mg total) daily with breakfast for 2 days, THEN 1 tablet (10 mg total) daily with breakfast for 2 days., Disp: 42 tablet, Rfl: 0   triamcinolone cream (KENALOG) 0.5 %, Apply 1 Application topically 2 (two) times daily. Do not use >14 days, Disp: 45 g, Rfl: 0   acetaminophen (TYLENOL) 500 MG tablet, Take 500 mg by mouth every 6 (six) hours as needed for moderate pain., Disp: , Rfl:    ibuprofen (ADVIL) 200 MG tablet, Take 400 mg by mouth every 6 (six) hours as needed for moderate pain., Disp: , Rfl:    meloxicam (MOBIC) 15 MG tablet, Take 1 tablet (15 mg total) by mouth daily. (Patient not taking: Reported on 03/13/2022), Disp: 30 tablet, Rfl: 2   oxyCODONE-acetaminophen (PERCOCET) 5-325 MG tablet, Take 1 tablet by mouth every 4 (four) hours as needed for severe pain. (Patient taking differently: Take 0.5 tablets by mouth every 4 (four) hours as needed for severe pain.), Disp: 20 tablet, Rfl: 0   sucralfate  (CARAFATE) 1 g tablet, Take 1 tablet (1 g total) by mouth 4 (four) times daily -  with meals and at bedtime for 7 days. (Patient not taking: Reported on 03/13/2022), Disp: 28 tablet, Rfl: 0   traMADol (ULTRAM) 50 MG tablet, Take 1 tablet (50 mg total) by mouth every 6 (six) hours as needed., Disp: 20 tablet, Rfl: 0   Medications ordered in this encounter:  Meds ordered this encounter  Medications   predniSONE (DELTASONE) 10 MG tablet    Sig: Take 6 tablets (60 mg total) by mouth daily with breakfast for 2 days, THEN 5 tablets (50 mg total) daily with breakfast for 2 days, THEN 4 tablets (40 mg total) daily with breakfast for 2 days, THEN 3 tablets (30 mg total) daily with breakfast for 2 days, THEN 2 tablets (20 mg total) daily with breakfast for 2 days, THEN 1 tablet (10 mg total) daily with breakfast for 2 days.    Dispense:  42 tablet    Refill:  0    Order Specific Question:   Supervising Provider    Answer:   Chase Picket [id number]   triamcinolone cream (KENALOG) 0.5 %    Sig: Apply 1 Application topically 2 (two) times daily. Do not use >14 days    Dispense:  45 g    Refill:  0    Order Specific Question:   Supervising Provider    Answer:   Chase Picket [  [id number]]     *If you need refills on other medications prior to your next appointment, please contact your pharmacy*  Follow-Up: Call back or seek an in-person evaluation if the symptoms worsen or if the condition fails to improve as anticipated.  [facility] [phone]  Other Instructions Your rash is consistent with Pityriasis Rosea This is not considered to be contagious Please take the oral steroid pack as prescribed via taper directions Apply the topical steroid cream to affected areas of trunk and arms. AVOID use on face, arm pits or groin. Do NOT use topical steroid cream > 2 weeks. Follow up with dermatology if your symptoms worsen or any new/ concerning symptoms occur.   If you have been  instructed to have an in-person evaluation today at a local Urgent Care facility, please use the link below. It will take you to a list of all of our available [facility], including address, phone number and hours of operation. Please do not delay care.  [facility]  If you or a family member do not have a primary care provider, use the link below to schedule a visit and establish care. When you choose a [facility] primary care physician or advanced practice provider, you gain a long-term partner in health. Find a Primary Care Provider  Learn more about [facility]'s in-office and virtual care options: [facility]

## 2022-07-21 NOTE — Progress Notes (Signed)
Virtual Visit Consent   Paul Newton, you are scheduled for a virtual visit with a [facility] provider today. Just as with appointments in the office, your consent must be obtained to participate. Your consent will be active for this visit and any virtual visit you may have with one of our providers in the next 365 days. If you have a MyChart account, a copy of this consent can be sent to you electronically.  As this is a virtual visit, video technology does not allow for your provider to perform a traditional examination. This may limit your provider's ability to fully assess your condition. If your provider identifies any concerns that need to be evaluated in person or the need to arrange testing (such as labs, EKG, etc.), we will make arrangements to do so. Although advances in technology are sophisticated, we cannot ensure that it will always work on either your end or our end. If the connection with a video visit is poor, the visit may have to be switched to a telephone visit. With either a video or telephone visit, we are not always able to ensure that we have a secure connection.  By engaging in this virtual visit, you consent to the provision of healthcare and authorize for your insurance to be billed (if applicable) for the services provided during this visit. Depending on your insurance coverage, you may receive a charge related to this service.  I need to obtain your verbal consent now. Are you willing to proceed with your visit today? Paul Newton has provided verbal consent on 07/21/2022 for a virtual visit (video or telephone). Paul Malling, PA  Date: 07/21/2022 8:44 PM  Virtual Visit via Video Note   I, Paul Newton, connected with  Paul Newton  ([id number], February 22, 1981) on 07/21/22 at  8:30 PM EST by a video-enabled telemedicine application and verified that I am speaking with the correct person using two identifiers.  Location: Patient: Virtual Visit Location Patient:  Home Provider: Virtual Visit Location Provider: Home Office   I discussed the limitations of evaluation and management by telemedicine and the availability of in person appointments. The patient expressed understanding and agreed to proceed.    History of Present Illness: Paul Newton is a 42 y.o. who identifies as a male who was assigned male at birth, and is being seen today for rash.  HPI: 42yo male presents today for a mildly pruritic rash to his torso and upper arms for the past week. States two weeks ago he was dealing with URI and ear issues. About one week ago, noted a few "weird spots" on his chest, and over the past few days notes it has gone to his whole torso and upper arms. Spares face, groin, legs and hands and feet. Notes the lesions are mildly raised, salmon colored and admits to having one lesion much larger than the rest on his trunk. Tried benadryl without much relief.   Problems:  Patient Active Problem List   Diagnosis Date Noted   Conductive hearing loss of left ear with unrestricted hearing of right ear 10/01/2020   Otorrhagia of left ear 10/01/2020   Arthralgia of left temporomandibular joint 03/01/2018   Acute non-recurrent sinusitis 05/27/2017   Eustachian tube dysfunction, left 09/02/2015    Allergies:  Allergies  Allergen Reactions   Hyoscyamine Other (See Comments)    Felt like he couldn't swallow- made his throat unusually "dry"    Medications:  Current Outpatient Medications:  predniSONE (DELTASONE) 10 MG tablet, Take 6 tablets (60 mg total) by mouth daily with breakfast for 2 days, THEN 5 tablets (50 mg total) daily with breakfast for 2 days, THEN 4 tablets (40 mg total) daily with breakfast for 2 days, THEN 3 tablets (30 mg total) daily with breakfast for 2 days, THEN 2 tablets (20 mg total) daily with breakfast for 2 days, THEN 1 tablet (10 mg total) daily with breakfast for 2 days., Disp: 42 tablet, Rfl: 0   triamcinolone cream (KENALOG) 0.5 %,  Apply 1 Application topically 2 (two) times daily. Do not use >14 days, Disp: 45 g, Rfl: 0   acetaminophen (TYLENOL) 500 MG tablet, Take 500 mg by mouth every 6 (six) hours as needed for moderate pain., Disp: , Rfl:    ibuprofen (ADVIL) 200 MG tablet, Take 400 mg by mouth every 6 (six) hours as needed for moderate pain., Disp: , Rfl:    meloxicam (MOBIC) 15 MG tablet, Take 1 tablet (15 mg total) by mouth daily. (Patient not taking: Reported on 03/13/2022), Disp: 30 tablet, Rfl: 2   oxyCODONE-acetaminophen (PERCOCET) 5-325 MG tablet, Take 1 tablet by mouth every 4 (four) hours as needed for severe pain. (Patient taking differently: Take 0.5 tablets by mouth every 4 (four) hours as needed for severe pain.), Disp: 20 tablet, Rfl: 0   sucralfate (CARAFATE) 1 g tablet, Take 1 tablet (1 g total) by mouth 4 (four) times daily -  with meals and at bedtime for 7 days. (Patient not taking: Reported on 03/13/2022), Disp: 28 tablet, Rfl: 0   traMADol (ULTRAM) 50 MG tablet, Take 1 tablet (50 mg total) by mouth every 6 (six) hours as needed., Disp: 20 tablet, Rfl: 0  Observations/Objective: Patient is well-developed, well-nourished in no acute distress.  Resting comfortably  at home.  Head is normocephalic, atraumatic.  No labored breathing.  Speech is clear and coherent with logical content.  Patient is alert and oriented at baseline.  Scaly salmon colored oval plaques with single herald patch generalized over trunk and proximal extremities  Assessment and Plan: 1. Pityriasis rosea  Pt's location of rash following viral URI most consistent with pityriasis rosea. Discussed self limited nature of the condition, however given mild pruritus and location, will do trial of both PO and topical steroids. Recommended pt follow up with derm in 3 weeks if no improvement.  Follow Up Instructions: I discussed the assessment and treatment plan with the patient. The patient was provided an opportunity to ask questions and  all were answered. The patient agreed with the plan and demonstrated an understanding of the instructions.  A copy of instructions were sent to the patient via MyChart unless otherwise noted below.    The patient was advised to call back or seek an in-person evaluation if the symptoms worsen or if the condition fails to improve as anticipated.  Time:  I spent 8 minutes with the patient via telehealth technology discussing the above problems/concerns.    [place], PA

## 2022-09-10 ENCOUNTER — Emergency Department
Admission: EM | Admit: 2022-09-10 | Discharge: 2022-09-10 | Disposition: A | Discharge status: Home / Self Care | Payer: Medicaid Other | Attending: Emergency Medicine | Admitting: Emergency Medicine

## 2022-09-10 ENCOUNTER — Emergency Department: Payer: Medicaid Other

## 2022-09-10 DIAGNOSIS — R9431 Abnormal electrocardiogram [ECG] [EKG]: Secondary | ICD-10-CM | POA: Diagnosis not present

## 2022-09-10 DIAGNOSIS — Z20822 Contact with and (suspected) exposure to covid-19: Secondary | ICD-10-CM | POA: Insufficient documentation

## 2022-09-10 DIAGNOSIS — M533 Sacrococcygeal disorders, not elsewhere classified: Secondary | ICD-10-CM | POA: Insufficient documentation

## 2022-09-10 DIAGNOSIS — R42 Dizziness and giddiness: Secondary | ICD-10-CM | POA: Insufficient documentation

## 2022-09-10 DIAGNOSIS — R109 Unspecified abdominal pain: Secondary | ICD-10-CM | POA: Diagnosis not present

## 2022-09-10 LAB — COMPREHENSIVE METABOLIC PANEL
ALT: 22 U/L (ref 0–44)
AST: 21 U/L (ref 15–41)
Albumin: 4.3 g/dL (ref 3.5–5.0)
Alkaline Phosphatase: 66 U/L (ref 38–126)
Anion gap: 10 (ref 5–15)
BUN: 16 mg/dL (ref 6–20)
CO2: 26 mmol/L (ref 22–32)
Calcium: 9.3 mg/dL (ref 8.9–10.3)
Chloride: 102 mmol/L (ref 98–111)
Creatinine, Ser: 1.08 mg/dL (ref 0.61–1.24)
GFR, Estimated: >60 mL/min (ref >60)
Glucose, Bld: 95 mg/dL (ref 70–99)
Potassium: 4.1 mmol/L (ref 3.5–5.1)
Sodium: 138 mmol/L (ref 135–145)
Total Bilirubin: 1.2 mg/dL (ref 0.3–1.2)
Total Protein: 7.9 g/dL (ref 6.5–8.1)

## 2022-09-10 LAB — CBC WITH DIFFERENTIAL/PLATELET
Abs Immature Granulocytes: 0.04 10*3/uL (ref 0.00–0.07)
Basophils Absolute: 0.1 10*3/uL (ref 0.0–0.1)
Basophils Relative: 1 %
Eosinophils Absolute: 0.1 10*3/uL (ref 0.0–0.5)
Eosinophils Relative: 1 %
HCT: 49.9 % (ref 39.0–52.0)
Hemoglobin: 16.8 g/dL (ref 13.0–17.0)
Immature Granulocytes: 0 %
Lymphocytes Relative: 23 %
Lymphs Abs: 2.1 10*3/uL (ref 0.7–4.0)
MCH: 31.6 pg (ref 26.0–34.0)
MCHC: 33.7 g/dL (ref 30.0–36.0)
MCV: 94 fL (ref 80.0–100.0)
Monocytes Absolute: 0.7 10*3/uL (ref 0.1–1.0)
Monocytes Relative: 8 %
Neutro Abs: 6.2 10*3/uL (ref 1.7–7.7)
Neutrophils Relative %: 67 %
Platelets: 274 10*3/uL (ref 150–400)
RBC: 5.31 MIL/uL (ref 4.22–5.81)
RDW: 13.1 % (ref 11.5–15.5)
WBC: 9.3 10*3/uL (ref 4.0–10.5)
nRBC: 0 % (ref 0.0–0.2)

## 2022-09-10 LAB — LIPASE, BLOOD: Lipase: 33 U/L (ref 11–51)

## 2022-09-10 LAB — URINALYSIS, ROUTINE W REFLEX MICROSCOPIC
Bacteria, UA: NONE SEEN
Bilirubin Urine: NEGATIVE
Glucose, UA: NEGATIVE mg/dL
Ketones, ur: 5 mg/dL — AB
Leukocytes,Ua: NEGATIVE
Nitrite: NEGATIVE
Protein, ur: NEGATIVE mg/dL
Specific Gravity, Urine: 1.029 (ref 1.005–1.030)
Squamous Epithelial / HPF: NONE SEEN /HPF (ref 0–5)
pH: 5 (ref 5.0–8.0)

## 2022-09-10 LAB — TROPONIN I (HIGH SENSITIVITY)
Troponin I (High Sensitivity): <2 ng/L (ref <18)
Troponin I (High Sensitivity): <2 ng/L (ref <18)

## 2022-09-10 LAB — RESP PANEL BY RT-PCR (RSV, FLU A&B, COVID)  RVPGX2
Influenza A by PCR: NEGATIVE
Influenza B by PCR: NEGATIVE
Resp Syncytial Virus by PCR: NEGATIVE
SARS Coronavirus 2 by RT PCR: NEGATIVE

## 2022-09-10 MED ORDER — SODIUM CHLORIDE 0.9 % IV BOLUS
500.0000 mL | Freq: Once | INTRAVENOUS | Status: AC
  Administered 2022-09-10: 500 mL via INTRAVENOUS

## 2022-09-10 MED ORDER — KETOROLAC TROMETHAMINE 30 MG/ML IJ SOLN
30.0000 mg | Freq: Once | INTRAMUSCULAR | Status: AC
  Administered 2022-09-10: 30 mg via INTRAVENOUS
  Filled 2022-09-10: qty 1

## 2022-09-10 NOTE — Discharge Instructions (Addendum)
Your exam, labs, EKG, and CT scan are all normal and reassuring. No signs of  infection, heart attack, or kidney injury, including stones. Your symptoms may be due to a viral infection or mild dehydration, or musculoskeletal injury. Take OTC Tylenol or Motrin as needed. Consider electrolyte water as needed.  Please go to the following website to schedule new (and existing) patient appointments:   [email or web address]   The following is a list of primary care offices in the area who are accepting new patients at this time.  Please reach out to one of them directly and let them know you would like to schedule an appointment to follow up on an Emergency Department visit, and/or to establish a new primary care provider (PCP).  There are likely other primary care clinics in the are who are accepting new patients, but this is an excellent place to start:  [facility] physician: Dr Lavon Paganini [address] [place], [place] [address] [phone]  [facility] Lead Physician: Dr Steele Sizer [address], [place], [place] [address] [phone]  [facility] Physician: Dr Park Liter [address] [place], [place] [address] [phone]  [facility] Lead Physician: Dr Dewaine Oats [address] [place], [place] [address] [phone]  [facility] Physician: Dr Halina Maidens [address] [place], [place], [place] [address] [phone]

## 2022-09-10 NOTE — ED Triage Notes (Signed)
Pt sts that he has been having back pain with dizziness. Pt sts that he is not having any pain with urination.

## 2022-09-10 NOTE — ED Provider Notes (Signed)
[facility] Emergency Department Provider Note     Event Date/Time   First MD Initiated Contact with Patient 09/10/22 1236     (approximate)   History   Back Pain   HPI  Paul Newton is a 42 y.o. male with a history of GERD, IBS, and conductive hearing loss on the right, presents to the ED for evaluation of back/flank pain with intermittent dizziness. He notes onset after admitting to a heavy drinking binge, over a few days last week.  He notes diarrheal stools, which is sometimes his baseline, with IBS. Patient denies any pain with urination.  He denies any recent injury, trauma, fall.  He denies any distal paresthesias, foot drop, or saddle anesthesia.  No FCS, bladder or bowel incontinence reported.   Physical Exam   Triage Vital Signs: ED Triage Vitals  Enc Vitals Group     BP 09/10/22 1226 (!) 147/98     Pulse Rate 09/10/22 1226 87     Resp 09/10/22 1226 17     Temp 09/10/22 1226 98.4 F (36.9 C)     Temp Source 09/10/22 1226 Oral     SpO2 09/10/22 1226 99 %     Weight 09/10/22 1224 200 lb (90.7 kg)     Height --      Head Circumference --      Peak Flow --      Pain Score --      Pain Loc --      Pain Edu? --      Excl. in [place]? --     Most recent vital signs: Vitals:   09/10/22 1226 09/10/22 1313  BP: (!) 147/98 129/76  Pulse: 87 69  Resp: 17 14  Temp: 98.4 F (36.9 C)   SpO2: 99% 99%    General Awake, no distress. NAD CV:  Good peripheral perfusion.  RESP:  Normal effort.  ABD:  No distention. Soft, nontender, normal bowel sounds noted. No CVA tenderness elicited.  MSK:  Midline spinal tenderness to the lumbosacral region. Normal AROM to UEs/LEs   ED Results / Procedures / Treatments   Labs (all labs ordered are listed, but only abnormal results are displayed) Labs Reviewed  URINALYSIS, ROUTINE W REFLEX MICROSCOPIC - Abnormal; Notable for the following components:      Result Value   Color, Urine YELLOW (*)     APPearance CLEAR (*)    Hgb urine dipstick MODERATE (*)    Ketones, ur 5 (*)    All other components within normal limits  RESP PANEL BY RT-PCR (RSV, FLU A&B, COVID)  RVPGX2  COMPREHENSIVE METABOLIC PANEL  LIPASE, BLOOD  CBC WITH DIFFERENTIAL/PLATELET  TROPONIN I (HIGH SENSITIVITY)  TROPONIN I (HIGH SENSITIVITY)   EKG  Vent. rate 72 BPM PR interval 136 ms QRS duration 94 ms QT/QTcB 370/405 ms P-R-T axes 59 75 32 Normal sinus rhythm Normal ECG  RADIOLOGY  I personally viewed and evaluated these images as part of my medical decision making, as well as reviewing the written report by the radiologist.  ED Provider Interpretation: NO Acute findings  CT ABDOMEN PELVIS WO CONTRAST  Result Date: 09/10/2022 CLINICAL DATA:  Back pain and flank pain. Unilaterally not mentioned. EXAM: CT ABDOMEN AND PELVIS WITHOUT CONTRAST TECHNIQUE: Multidetector CT imaging of the abdomen and pelvis was performed following the standard protocol without IV contrast. RADIATION DOSE REDUCTION: This exam was performed according to the departmental dose-optimization program which includes automated exposure control, adjustment  of the mA and/or kV according to patient size and/or use of iterative reconstruction technique. COMPARISON:  04/30/2021 FINDINGS: Lower chest: The lung bases are clear of acute process. No pleural effusion or pulmonary lesions. The heart is normal in size. No pericardial effusion. The distal esophagus and aorta are unremarkable. Hepatobiliary: No hepatic lesions are identified without contrast. No intrahepatic biliary dilatation. The gallbladder is unremarkable. No common bile duct dilatation. Pancreas: No mass, inflammation or ductal dilatation. Spleen: Normal size.  No focal lesions. Adrenals/Urinary Tract: Adrenal glands are normal. No renal, ureteral or bladder calculi or mass. Stomach/Bowel: The stomach, duodenum, small bowel and colon unremarkable. No acute inflammatory process, mass lesions  or obstructive findings. The terminal ileum and appendix are normal. Vascular/Lymphatic: The aorta is normal in caliber. No atheroscerlotic calcifications. No mesenteric of retroperitoneal mass or adenopathy. Small scattered lymph nodes are noted. Reproductive: The prostate gland and seminal vesicles are unremarkable. Other: No pelvic mass or adenopathy. No free pelvic fluid collections. No inguinal mass or adenopathy. No abdominal wall hernia or subcutaneous lesions. Musculoskeletal: No significant bony findings. IMPRESSION: 1. No acute abdominal/pelvic findings, mass lesions or adenopathy. 2. No renal, ureteral or bladder calculi or mass. Electronically Signed   By: Marijo Sanes M.D.   On: 09/10/2022 15:49     PROCEDURES:  Critical Care performed: No  Procedures   MEDICATIONS ORDERED IN ED: Medications  ketorolac (TORADOL) 30 MG/ML injection 30 mg (has no administration in time range)  sodium chloride 0.9 % bolus 500 mL (500 mLs Intravenous New Bag/Given 09/10/22 1524)     IMPRESSION / MDM / ASSESSMENT AND PLAN / ED COURSE  I reviewed the triage vital signs and the nursing notes.                              Differential diagnosis includes, but is not limited to, acute appendicitis, renal colic, testicular torsion, urinary tract infection/pyelonephritis, prostatitis,  epididymitis, diverticulitis, small bowel obstruction or ileus, colitis, abdominal aortic aneurysm, gastroenteritis, hernia, etc.   Patient's presentation is most consistent with acute complicated illness / injury requiring diagnostic workup.  Patient's diagnosis is consistent with likely musculoskeletal pain as etiology for flank pain.  Patient with reassuring exam and workup overall.  Labs overall normal with no evidence of acute abnormalities to WBCs, CMP, Trope x 2, no evidence of elevated lipase.  UA without signs of dehydration or acute UTI.  Further evaluation with CT imaging does not reveal any acute intra-abdominal  processes to include renal calculi or hydronephrosis.  Patient's symptoms likely musculoskeletal in nature.  Patient reports improvement after fluid bolus in the ED.  Patient will be discharged home instructions to drink OTC electrolyte water. Patient is to follow up with primary provider as needed or otherwise directed. Patient is given ED precautions to return to the ED for any worsening or new symptoms.     FINAL CLINICAL IMPRESSION(S) / ED DIAGNOSES   Final diagnoses:  Bilateral flank pain     Rx / DC Orders   ED Discharge Orders     None        Note:  This document was prepared using Dragon voice recognition software and may include unintentional dictation errors.    Melvenia Needles, PA-C 09/10/22 1623    Rada Hay, MD 09/10/22 [phone]

## 2022-10-08 ENCOUNTER — Ambulatory Visit
Admission: EM | Admit: 2022-10-08 | Discharge: 2022-10-08 | Disposition: A | Discharge status: Home / Self Care | Payer: Medicaid Other | Attending: Family Medicine | Admitting: Family Medicine

## 2022-10-08 DIAGNOSIS — J029 Acute pharyngitis, unspecified: Secondary | ICD-10-CM | POA: Diagnosis not present

## 2022-10-08 DIAGNOSIS — J069 Acute upper respiratory infection, unspecified: Secondary | ICD-10-CM | POA: Diagnosis not present

## 2022-10-08 LAB — POCT INFLUENZA A/B
Influenza A, POC: NEGATIVE
Influenza B, POC: NEGATIVE

## 2022-10-08 LAB — POCT RAPID STREP A (OFFICE): Rapid Strep A Screen: NEGATIVE

## 2022-10-08 NOTE — Discharge Instructions (Addendum)
You were seen today for upper respiratory symptoms.  Your strep test was negative, but will be sent for culture.  If positive you will be called to start treatment.  Your flu swab was negative today as well.  Your symptoms appear viral at this time.  I recommend you use tylenol or motrin for you symptoms.  You may use salt water gargles for sore throat.

## 2022-10-08 NOTE — ED Provider Notes (Signed)
[facility]    CSN: [id number] Arrival date & time: 10/08/22  1156      History   Chief Complaint Chief Complaint  Patient presents with   Sore Throat   Generalized Body Aches    HPI Paul Newton is a 42 y.o. male.   He woke up 2 days ago not feeling well.  He had a sore throat, cough, dizzy, headaches. No runny nose or congestion.  Mild sob, wheezing.  Still with sore throat, throat is red and swollen.  He has had chills, no fever.  Also with diarrhea.  He is using otc vicks/sinus medication.  No known sick contacts.  Home covid test was negative        Past Medical History:  Diagnosis Date   Complication of anesthesia    slow to wake up   COVID 2020   mild case   GERD (gastroesophageal reflux disease)    Pneumonia     Patient Active Problem List   Diagnosis Date Noted   Conductive hearing loss of left ear with unrestricted hearing of right ear 10/01/2020   Otorrhagia of left ear 10/01/2020   Arthralgia of left temporomandibular joint 03/01/2018   Acute non-recurrent sinusitis 05/27/2017   Eustachian tube dysfunction, left 09/02/2015    Past Surgical History:  Procedure Laterality Date   CLOSED REDUCTION METACARPAL WITH PERCUTANEOUS PINNING Right 03/18/2022   Procedure: closed reduction percutaneous pinning right ring finger and right small finger metacarpal fracture;  Surgeon: Charlotte Crumb, MD;  Location: [place];  Service: Orthopedics;  Laterality: Right;  Axillary block/ mac   LYMPHADENECTOMY     Right neck R/T swelling with airway compromise during bout with pneumonia       Home Medications    Prior to Admission medications   Medication Sig Start Date End Date Taking? Authorizing Provider  acetaminophen (TYLENOL) 500 MG tablet Take 500 mg by mouth every 6 (six) hours as needed for moderate pain.   Yes [provider]  ibuprofen (ADVIL) 200 MG tablet Take 400 mg by mouth every 6 (six) hours as needed for moderate  pain.   Yes [provider]  meloxicam (MOBIC) 15 MG tablet Take 1 tablet (15 mg total) by mouth daily. Patient not taking: Reported on 03/13/2022 03/07/22 03/07/23  Caryn Section Linden Dolin, PA-C  oxyCODONE-acetaminophen (PERCOCET) 5-325 MG tablet Take 1 tablet by mouth every 4 (four) hours as needed for severe pain. Patient taking differently: Take 0.5 tablets by mouth every 4 (four) hours as needed for severe pain. 03/07/22 03/07/23  Fisher, Linden Dolin, PA-C  sucralfate (CARAFATE) 1 g tablet Take 1 tablet (1 g total) by mouth 4 (four) times daily -  with meals and at bedtime for 7 days. Patient not taking: Reported on 03/13/2022 02/16/22 03/13/22  Jeanell Sparrow, DO  traMADol (ULTRAM) 50 MG tablet Take 1 tablet (50 mg total) by mouth every 6 (six) hours as needed. 03/18/22 03/18/23  Charlotte Crumb, MD  triamcinolone cream (KENALOG) 0.5 % Apply 1 Application topically 2 (two) times daily. Do not use >14 days 07/21/22   Geryl Councilman L, PA  dicyclomine (BENTYL) 20 MG tablet Take 1 tablet (20 mg total) by mouth 2 (two) times daily. 02/14/12 07/06/12  Domingo Dimes, PA-C    Family History Family History  Problem Relation Age of Onset   Arthritis Mother     Social History Social History   Tobacco Use   Smoking status: Some Days    Packs/day: .  25    Types: Cigarettes   Smokeless tobacco: Never  Vaping Use   Vaping Use: Never used  Substance Use Topics   Alcohol use: Not Currently    Comment: occasional   Drug use: Not Currently     Allergies   Hyoscyamine   Review of Systems Review of Systems  Constitutional:  Positive for chills and fatigue. Negative for fever.  HENT:  Positive for sore throat. Negative for congestion and rhinorrhea.   Respiratory:  Positive for cough.   Gastrointestinal:  Positive for diarrhea.  Musculoskeletal:  Positive for arthralgias and myalgias.  Skin: Negative.   Psychiatric/Behavioral: Negative.       Physical Exam Triage Vital Signs ED Triage  Vitals  Enc Vitals Group     BP 10/08/22 1318 131/86     Pulse Rate 10/08/22 1318 63     Resp 10/08/22 1318 18     Temp 10/08/22 1318 98 F (36.7 C)     Temp Source 10/08/22 1318 Oral     SpO2 10/08/22 1318 98 %     Weight --      Height --      Head Circumference --      Peak Flow --      Pain Score 10/08/22 1316 3     Pain Loc --      Pain Edu? --      Excl. in [place]? --    No data found.  Updated Vital Signs BP 131/86 (BP Location: Left Arm)   Pulse 63   Temp 98 F (36.7 C) (Oral)   Resp 18   SpO2 98%   Visual Acuity Right Eye Distance:   Left Eye Distance:   Bilateral Distance:    Right Eye Near:   Left Eye Near:    Bilateral Near:     Physical Exam Constitutional:      Appearance: He is well-developed.  HENT:     Right Ear: Tympanic membrane normal.     Left Ear: Tympanic membrane normal.     Nose: No congestion or rhinorrhea.     Mouth/Throat:     Mouth: Mucous membranes are moist.     Pharynx: Pharyngeal swelling and posterior oropharyngeal erythema present. No oropharyngeal exudate.     Tonsils: 2+ on the right. 2+ on the left.  Cardiovascular:     Rate and Rhythm: Normal rate and regular rhythm.     Heart sounds: Normal heart sounds.  Pulmonary:     Effort: Pulmonary effort is normal.  Musculoskeletal:     Cervical back: Normal range of motion and neck supple.  Lymphadenopathy:     Cervical: Cervical adenopathy present.  Skin:    General: Skin is warm.  Neurological:     General: No focal deficit present.     Mental Status: He is alert.  Psychiatric:        Mood and Affect: Mood normal.      UC Treatments / Results  Labs (all labs ordered are listed, but only abnormal results are displayed) Labs Reviewed  CULTURE, GROUP A STREP [facility])  POCT RAPID STREP A (OFFICE)  POCT INFLUENZA A/B    EKG   Radiology No results found.  Procedures Procedures (including critical care time)  Medications Ordered in UC Medications - No data to  display  Initial Impression / Assessment and Plan / UC Course  I have reviewed the triage vital signs and the nursing notes.  Pertinent labs & imaging results that  were available during my care of the patient were reviewed by me and considered in my medical decision making (see chart for details).  Final Clinical Impressions(s) / UC Diagnoses   Final diagnoses:  Sore throat  Acute upper respiratory infection     Discharge Instructions      You were seen today for upper respiratory symptoms.  Your strep test was negative, but will be sent for culture.  If positive you will be called to start treatment.  Your flu swab was negative today as well.  Your symptoms appear viral at this time.  I recommend you use tylenol or motrin for you symptoms.  You may use salt water gargles for sore throat.     ED Prescriptions   None    PDMP not reviewed this encounter.   Rondel Oh, MD 10/08/22 [phone]

## 2022-10-08 NOTE — ED Triage Notes (Signed)
Pt presents with sore throat, HA, body aches, off-balance, facial pressure x 2 days. No fever. No known exposures. Home COVID negative. Taking Vicks sinus and cold with a little relief.

## 2022-10-10 LAB — CULTURE, GROUP A STREP (THRC)

## 2022-10-14 DIAGNOSIS — H6501 Acute serous otitis media, right ear: Secondary | ICD-10-CM | POA: Diagnosis not present

## 2022-10-14 DIAGNOSIS — H9212 Otorrhea, left ear: Secondary | ICD-10-CM | POA: Diagnosis not present

## 2022-10-14 DIAGNOSIS — H6993 Unspecified Eustachian tube disorder, bilateral: Secondary | ICD-10-CM | POA: Diagnosis not present

## 2023-01-17 ENCOUNTER — Emergency Department: Payer: Medicaid Other

## 2023-01-17 ENCOUNTER — Other Ambulatory Visit: Payer: Self-pay

## 2023-01-17 ENCOUNTER — Emergency Department
Admission: EM | Admit: 2023-01-17 | Discharge: 2023-01-17 | Disposition: A | Discharge status: Home / Self Care | Payer: Medicaid Other | Attending: Emergency Medicine | Admitting: Emergency Medicine

## 2023-01-17 DIAGNOSIS — S62312A Displaced fracture of base of third metacarpal bone, right hand, initial encounter for closed fracture: Secondary | ICD-10-CM | POA: Insufficient documentation

## 2023-01-17 DIAGNOSIS — S62302A Unspecified fracture of third metacarpal bone, right hand, initial encounter for closed fracture: Secondary | ICD-10-CM | POA: Diagnosis not present

## 2023-01-17 DIAGNOSIS — W228XXA Striking against or struck by other objects, initial encounter: Secondary | ICD-10-CM | POA: Diagnosis not present

## 2023-01-17 DIAGNOSIS — S62322D Displaced fracture of shaft of third metacarpal bone, right hand, subsequent encounter for fracture with routine healing: Secondary | ICD-10-CM | POA: Diagnosis not present

## 2023-01-17 DIAGNOSIS — M79641 Pain in right hand: Secondary | ICD-10-CM | POA: Diagnosis present

## 2023-01-17 MED ORDER — OXYCODONE-ACETAMINOPHEN 5-325 MG PO TABS: 1.0000 | ORAL_TABLET | ORAL | 0 refills | Status: AC | PRN

## 2023-01-17 NOTE — Discharge Instructions (Addendum)
You were seen in the emergency room today for evaluation of your hand pain.  Your x-Paul Newton did show a fracture of a bone in your hand called metacarpal.  We have placed you in a splint.  Please contact your hand doctor to arrange follow-up within the next week.  I sent a short course of pain medicine to your pharmacy that you can take as needed.  This does make you drowsy, do not drive or operate machinery when taking this.  Return to the ER for new or worsening symptoms.

## 2023-01-17 NOTE — ED Notes (Signed)
Patient is ready to leave, states he will just leave. Dr. Rosalia Hammers aware. Significant other at bedside.
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
## 2023-01-20 DIAGNOSIS — S62300A Unspecified fracture of second metacarpal bone, right hand, initial encounter for closed fracture: Secondary | ICD-10-CM | POA: Diagnosis not present

## 2023-02-04 DIAGNOSIS — S62302D Unspecified fracture of third metacarpal bone, right hand, subsequent encounter for fracture with routine healing: Secondary | ICD-10-CM | POA: Diagnosis not present

## 2023-02-25 DIAGNOSIS — S62302D Unspecified fracture of third metacarpal bone, right hand, subsequent encounter for fracture with routine healing: Secondary | ICD-10-CM | POA: Diagnosis not present

## 2023-04-08 ENCOUNTER — Ambulatory Visit: Payer: Medicaid Other | Admitting: Family Medicine

## 2023-04-08 NOTE — Progress Notes (Deleted)
New Patient Office Visit  Subjective    Patient ID: Paul Newton, male    DOB: 07-03-1981  Age: 42 y.o. MRN: [id number]  CC: No chief complaint on file.   HPI Paul Newton presents to establish care ***  Hand fracture-  Alcohol use  Kidney stones?  Ear complaints-sees otolaryngology  PMH: ***  PSH: ***  FH: ***  Tobacco use: *** Alcohol use: *** Drug use: *** Marital status: *** Employment: *** Sexual hx: ***  Screenings:  Colon Cancer: *** Lung Cancer: *** Breast Cancer: *** Diabetes: *** HLD: ***   Outpatient Encounter Medications as of 04/08/2023  Medication Sig   acetaminophen (TYLENOL) 500 MG tablet Take 500 mg by mouth every 6 (six) hours as needed for moderate pain.   ibuprofen (ADVIL) 200 MG tablet Take 400 mg by mouth every 6 (six) hours as needed for moderate pain.   sucralfate (CARAFATE) 1 g tablet Take 1 tablet (1 g total) by mouth 4 (four) times daily -  with meals and at bedtime for 7 days. (Patient not taking: Reported on 03/13/2022)   [DISCONTINUED] dicyclomine (BENTYL) 20 MG tablet Take 1 tablet (20 mg total) by mouth 2 (two) times daily.   No facility-administered encounter medications on file as of 04/08/2023.    Past Medical History:  Diagnosis Date   Complication of anesthesia    slow to wake up   COVID 2020   mild case   GERD (gastroesophageal reflux disease)    Pneumonia     Past Surgical History:  Procedure Laterality Date   CLOSED REDUCTION METACARPAL WITH PERCUTANEOUS PINNING Right 03/18/2022   Procedure: closed reduction percutaneous pinning right ring finger and right small finger metacarpal fracture;  Surgeon: Dairl Ponder, MD;  Location: [facility];  Service: Orthopedics;  Laterality: Right;  Axillary block/ mac   LYMPHADENECTOMY     Right neck R/T swelling with airway compromise during bout with pneumonia    Family History  Problem Relation Age of Onset   Arthritis Mother     Social History    Socioeconomic History   Marital status: Single    Spouse name: Not on file   Number of children: Not on file   Years of education: Not on file   Highest education level: Not on file  Occupational History   Not on file  Tobacco Use   Smoking status: Some Days    Current packs/day: 0.25    Types: Cigarettes   Smokeless tobacco: Never  Vaping Use   Vaping status: Never Used  Substance and Sexual Activity   Alcohol use: Not Currently    Comment: occasional   Drug use: Not Currently   Sexual activity: Yes  Other Topics Concern   Not on file  Social History Narrative   Not on file   Social Determinants of Health   Financial Resource Strain: Not on file  Food Insecurity: Not on file  Transportation Needs: Not on file  Physical Activity: Not on file  Stress: Not on file  Social Connections: Not on file  Intimate Partner Violence: Not on file    ROS      Objective    There were no vitals taken for this visit.  Physical Exam     Assessment & Plan:   There are no diagnoses linked to this encounter.  No follow-ups on file.   Sandre Kitty, MD

## 2023-07-22 ENCOUNTER — Encounter: Payer: Self-pay | Admitting: Family Medicine

## 2023-07-22 ENCOUNTER — Ambulatory Visit: Payer: Medicaid Other | Admitting: Family Medicine

## 2023-07-22 VITALS — BP 141/95 | HR 84 | Ht 67.0 in | Wt 203.1 lb

## 2023-07-22 DIAGNOSIS — M546 Pain in thoracic spine: Secondary | ICD-10-CM | POA: Diagnosis not present

## 2023-07-22 DIAGNOSIS — E66811 Obesity, class 1: Secondary | ICD-10-CM | POA: Diagnosis not present

## 2023-07-22 DIAGNOSIS — Z1159 Encounter for screening for other viral diseases: Secondary | ICD-10-CM | POA: Diagnosis not present

## 2023-07-22 DIAGNOSIS — R5383 Other fatigue: Secondary | ICD-10-CM | POA: Diagnosis not present

## 2023-07-22 DIAGNOSIS — Z9189 Other specified personal risk factors, not elsewhere classified: Secondary | ICD-10-CM

## 2023-07-22 DIAGNOSIS — Z7689 Persons encountering health services in other specified circumstances: Secondary | ICD-10-CM

## 2023-07-22 DIAGNOSIS — G8929 Other chronic pain: Secondary | ICD-10-CM

## 2023-07-22 NOTE — Progress Notes (Signed)
 New Patient Office Visit  Subjective   Patient ID: TRAVONTE BYARD, male    DOB: [birth year]  Age: 43 y.o. MRN: [id number]  CC:  Chief Complaint  Patient presents with   New Patient (Initial Visit)    HPI Paul Newton presents to establish care  Patient has not seen a primary care provider in several years.  He takes a multivitamin otherwise does not take any medications.  Was prescribed a stomach medicine but rarely takes that.  Patient's main complaint today is fatigue.  Has been having worsening fatigue for the past year.  Mostly mid afternoon.  Has headaches in the mornings.  Feels sluggish.  Generally goes to sleep around midnight and wakes up around 730 or 8 AM.  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  Patient states he has been told he snores.  Occasionally will has episodes of feeling like he wakes up out of breath.   patient also complaining of midthoracic back pain.  Feels like it is more midline.  Describes it as a sharp pain.  Does not radiate.  No numbness or tingling in the extremities.  Does not currently take any medication for it.  Does not like taking medication.   PMH: no  PSH: lymph node removal.  Hand fracture  FH: no. Mother - rare lung disease.    Tobacco use: 1-2 ppd.  43 yo.  27 years .  Alcohol use: here and there.  Once per week.   Drug use: none Marital status: married.  2 children.   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   [DISCONTINUED] dicyclomine  (BENTYL ) 20 MG tablet Take 1 tablet (20 mg total) by mouth 2 (two) times daily.   [DISCONTINUED] ibuprofen  (ADVIL ) 200 MG tablet Take 400 mg by mouth every 6 (six) hours as needed for moderate pain.   [DISCONTINUED] sucralfate  (CARAFATE ) 1 g tablet Take 1 tablet (1 g total) by mouth 4 (four)  times daily -  with meals and at bedtime for 7 days. (Patient not taking: Reported on 03/13/2022)   No facility-administered encounter medications on file as of 07/22/2023.    Past Medical History:  Diagnosis Date   Complication of anesthesia    slow to wake up   COVID 2020   mild case   GERD (gastroesophageal reflux disease)    Pneumonia     Past Surgical History:  Procedure Laterality Date   CLOSED REDUCTION METACARPAL WITH PERCUTANEOUS PINNING Right 03/18/2022   Procedure: closed reduction percutaneous pinning right ring finger and right small finger metacarpal fracture;  Surgeon: Sissy Cough, MD;  Location: [facility];  Service: Orthopedics;  Laterality: Right;  Axillary block/ mac   LYMPHADENECTOMY     Right neck R/T swelling with airway compromise during bout with pneumonia    Family History  Problem Relation Age of Onset   Arthritis Mother     Social History   Socioeconomic History   Marital status: Single    Spouse name: Not on file   Number of children: Not on file   Years of education: Not on file   Highest education level: Not on file  Occupational History   Not on file  Tobacco Use   Smoking status: Some Days    Current packs/day: 0.25    Types: Cigarettes  Smokeless tobacco: Never  Vaping Use   Vaping status: Never Used  Substance and Sexual Activity   Alcohol use: Not Currently    Comment: occasional   Drug use: Not Currently   Sexual activity: Yes  Other Topics Concern   Not on file  Social History Narrative   Not on file   Social Drivers of Health   Financial Resource Strain: Not on file  Food Insecurity: Not on file  Transportation Needs: Not on file  Physical Activity: Not on file  Stress: Not on file  Social Connections: Not on file  Intimate Partner Violence: Not on file    ROS     Objective   BP (!) 141/95   Pulse 84   Ht 5' 7 (1.702 m)   Wt 203 lb 1.9 oz (92.1 kg)   SpO2 97%   BMI 31.81 kg/m   Physical Exam General:  Alert, oriented.  Appears older than stated age. HEENT: PERRLA, EOMI, moist mucosa CV: Regular rate rhythm no murmurs Pulmonary: Scar bilaterally no wheeze or crackles MSK: Strength equal bilaterally.  No tenderness palpation of the spine. GI: Soft, nontender.  Normal bowel sounds Skin: Tanned, sun damaged. Psych: Pleasant affect.     Assessment & Plan:   Encounter to establish care  Other fatigue Assessment & Plan: Going on over a year.  Has some difficulty with sleep although endorses 7 hours on average.  Does have snoring and possible apneic episodes at night.  Will get blood testing and if this is negative we will consider sleep study referral.  Orders: -     Comprehensive metabolic panel -     TSH -     Testosterone  -     CBC with Differential/Platelet  Encounter for hepatitis C virus screening test for high risk patient -     Hepatitis C antibody  Obesity (BMI 30.0-34.9) -     Hemoglobin A1c -     Lipid panel  Chronic midline thoracic back pain Assessment & Plan: Does not radiate..  Localized to the midline thoracic region.  Worse after physical activity.  Nontender on exam.  Will get thoracic x-rays.  Discussed conservative management with over-the-counter treatment.  Orders: -     DG Thoracic Spine 2 View; Future    Return in about 7 weeks (around 09/09/2023) for HTN, fatigue.   Toribio [name] Slain, MD

## 2023-07-22 NOTE — Patient Instructions (Signed)
 It was nice to see you today,  We addressed the following topics today: -I have ordered some blood tests for evaluation of your fatigue and other issues. - I have ordered an x-ray of the back to look for causes of your back pain. - You can go to [facility] imaging on [address] at any time during their business hours to have your x-ray done - For pain I would recommend taking Tylenol  and topical treatments first-line.  You can take 1000 mg of Tylenol  every 8 hours.  Topical treatments can include over-the-counter Voltaren gel, lidocaine creams, or menthol containing creams.  You can also use topical heat. - If none of the above are helpful you can use Aleve twice a day but I would avoid using this for more than 2 weeks at a time. - Follow-up with me in approximately 6 to 8 weeks.  Have a great day,  Rolan Slain, MD

## 2023-07-22 NOTE — Assessment & Plan Note (Signed)
 Going on over a year.  Has some difficulty with sleep although endorses 7 hours on average.  Does have snoring and possible apneic episodes at night.  Will get blood testing and if this is negative we will consider sleep study referral.

## 2023-07-22 NOTE — Assessment & Plan Note (Signed)
 Does not radiate..  Localized to the midline thoracic region.  Worse after physical activity.  Nontender on exam.  Will get thoracic x-rays.  Discussed conservative management with over-the-counter treatment.

## 2023-07-23 LAB — CBC WITH DIFFERENTIAL/PLATELET
Basophils Absolute: 0.1 10*3/uL (ref 0.0–0.2)
Basos: 1 %
EOS (ABSOLUTE): 0.2 10*3/uL (ref 0.0–0.4)
Eos: 2 %
Hematocrit: 49.2 % (ref 37.5–51.0)
Hemoglobin: 16.8 g/dL (ref 13.0–17.7)
Immature Grans (Abs): 0.1 10*3/uL (ref 0.0–0.1)
Immature Granulocytes: 1 %
Lymphocytes Absolute: 3.3 10*3/uL — ABNORMAL HIGH (ref 0.7–3.1)
Lymphs: 27 %
MCH: 32.9 pg (ref 26.6–33.0)
MCHC: 34.1 g/dL (ref 31.5–35.7)
MCV: 96 fL (ref 79–97)
Monocytes Absolute: 1.1 10*3/uL — ABNORMAL HIGH (ref 0.1–0.9)
Monocytes: 9 %
Neutrophils Absolute: 7.4 10*3/uL — ABNORMAL HIGH (ref 1.4–7.0)
Neutrophils: 60 %
Platelets: 286 10*3/uL (ref 150–450)
RBC: 5.11 x10E6/uL (ref 4.14–5.80)
RDW: 13.1 % (ref 11.6–15.4)
WBC: 12.1 10*3/uL — ABNORMAL HIGH (ref 3.4–10.8)

## 2023-07-23 LAB — TSH: TSH: 2.68 u[IU]/mL (ref 0.450–4.500)

## 2023-07-23 LAB — COMPREHENSIVE METABOLIC PANEL
ALT: 33 [IU]/L (ref 0–44)
AST: 26 [IU]/L (ref 0–40)
Albumin: 4.4 g/dL (ref 4.1–5.1)
Alkaline Phosphatase: 83 [IU]/L (ref 44–121)
BUN/Creatinine Ratio: 11 (ref 9–20)
BUN: 12 mg/dL (ref 6–24)
Bilirubin Total: 0.7 mg/dL (ref 0.0–1.2)
CO2: 23 mmol/L (ref 20–29)
Calcium: 9.3 mg/dL (ref 8.7–10.2)
Chloride: 102 mmol/L (ref 96–106)
Creatinine, Ser: 1.14 mg/dL (ref 0.76–1.27)
Globulin, Total: 2.8 g/dL (ref 1.5–4.5)
Glucose: 85 mg/dL (ref 70–99)
Potassium: 3.9 mmol/L (ref 3.5–5.2)
Sodium: 140 mmol/L (ref 134–144)
Total Protein: 7.2 g/dL (ref 6.0–8.5)
eGFR: 82 mL/min/{1.73_m2} (ref >59)

## 2023-07-23 LAB — HEMOGLOBIN A1C
Est. average glucose Bld gHb Est-mCnc: 111 mg/dL
Hgb A1c MFr Bld: 5.5 % (ref 4.8–5.6)

## 2023-07-23 LAB — TESTOSTERONE: Testosterone: 585 ng/dL (ref 264–916)

## 2023-07-23 LAB — LIPID PANEL
Chol/HDL Ratio: 5.5 {ratio} — ABNORMAL HIGH (ref 0.0–5.0)
Cholesterol, Total: 226 mg/dL — ABNORMAL HIGH (ref 100–199)
HDL: 41 mg/dL (ref >39)
LDL Chol Calc (NIH): 166 mg/dL — ABNORMAL HIGH (ref 0–99)
Triglycerides: 107 mg/dL (ref 0–149)
VLDL Cholesterol Cal: 19 mg/dL (ref 5–40)

## 2023-07-23 LAB — HEPATITIS C ANTIBODY: Hep C Virus Ab: NONREACTIVE

## 2023-07-28 ENCOUNTER — Ambulatory Visit
Admission: RE | Admit: 2023-07-28 | Discharge: 2023-07-28 | Disposition: A | Discharge status: Home / Self Care | Payer: Medicaid Other | Source: Ambulatory Visit | Attending: Family Medicine | Admitting: Family Medicine

## 2023-07-28 VITALS — BP 155/106 | HR 96 | Temp 98.2°F | Resp 17

## 2023-07-28 DIAGNOSIS — B349 Viral infection, unspecified: Secondary | ICD-10-CM

## 2023-07-28 DIAGNOSIS — L237 Allergic contact dermatitis due to plants, except food: Secondary | ICD-10-CM

## 2023-07-28 MED ORDER — PROMETHAZINE-DM 6.25-15 MG/5ML PO SYRP: 5.0000 mL | ORAL_SOLUTION | Freq: Four times a day (QID) | ORAL | 0 refills | Status: AC | PRN

## 2023-07-28 MED ORDER — TRIAMCINOLONE ACETONIDE 0.1 % EX CREA: 1.0000 | TOPICAL_CREAM | Freq: Two times a day (BID) | CUTANEOUS | 0 refills | Status: DC

## 2023-07-28 MED ORDER — HYDROXYZINE HCL 25 MG PO TABS: 25.0000 mg | ORAL_TABLET | Freq: Three times a day (TID) | ORAL | 0 refills | Status: DC | PRN

## 2023-07-28 NOTE — ED Provider Notes (Signed)
 [facility]    CSN: [id number] Arrival date & time: 07/28/23  1819      History   Chief Complaint Chief Complaint  Patient presents with   Cough    Been having flu, cold symptoms since about Friday, sore throat, headache, fatigue, diarrhea, coughing pain in chest and back and also has poison ivy - Entered by patient    HPI SIRE POET is a 43 y.o. male.    Cough Associated symptoms: headaches, rash, rhinorrhea and sore throat   Associated symptoms: no fever and no shortness of breath   Cough for 5 days occasional brown sputum.  Admits nasal, rhinorrhea, fatigue, sore, diarrhea.  Has some soreness in his chest and his back when he coughs.  Denies documented fever, chills, sweats, body aches.  Also complaining of poison ivy.  He is a smoker.  Past Medical History:  Diagnosis Date   Complication of anesthesia    slow to wake up   COVID 2020   mild case   GERD (gastroesophageal reflux disease)    Pneumonia     Patient Active Problem List   Diagnosis Date Noted   Other fatigue 07/22/2023   Chronic midline thoracic back pain 07/22/2023   Conductive hearing loss of left ear with unrestricted hearing of right ear 10/01/2020   Eustachian tube dysfunction, left 09/02/2015    Past Surgical History:  Procedure Laterality Date   CLOSED REDUCTION METACARPAL WITH PERCUTANEOUS PINNING Right 03/18/2022   Procedure: closed reduction percutaneous pinning right ring finger and right small finger metacarpal fracture;  Surgeon: Sissy Cough, MD;  Location: [facility] OR;  Service: Orthopedics;  Laterality: Right;  Axillary block/ mac   LYMPHADENECTOMY     Right neck R/T swelling with airway compromise during bout with pneumonia       Home Medications    Prior to Admission medications   Medication Sig Start Date End Date Taking? Authorizing Provider  dicyclomine  (BENTYL ) 20 MG tablet Take 1 tablet (20 mg total) by mouth 2 (two) times daily. 02/14/12 07/06/12  Leontine Thurnell RAMAN, PA-C    Family History Family History  Problem Relation Age of Onset   Arthritis Mother     Social History Social History   Tobacco Use   Smoking status: Some Days    Current packs/day: 0.25    Types: Cigarettes   Smokeless tobacco: Never  Vaping Use   Vaping status: Never Used  Substance Use Topics   Alcohol use: Not Currently    Comment: occasional   Drug use: Not Currently     Allergies   Hyoscyamine   Review of Systems Review of Systems  Constitutional:  Positive for fatigue. Negative for appetite change and fever.  HENT:  Positive for rhinorrhea and sore throat. Negative for trouble swallowing.   Respiratory:  Positive for cough. Negative for shortness of breath.   Gastrointestinal:  Positive for diarrhea. Negative for vomiting.  Skin:  Positive for rash.  Neurological:  Positive for headaches. Negative for dizziness.     Physical Exam Triage Vital Signs ED Triage Vitals [07/28/23 1904]  Encounter Vitals Group     BP (!) 155/106     Systolic BP Percentile      Diastolic BP Percentile      Pulse Rate 96     Resp 17     Temp 98.2 F (36.8 C)     Temp Source Oral     SpO2 96 %     Weight  Height      Head Circumference      Peak Flow      Pain Score      Pain Loc      Pain Education      Exclude from Growth Chart    No data found.  Updated Vital Signs BP (!) 155/106 (BP Location: Right Arm)   Pulse 96   Temp 98.2 F (36.8 C) (Oral)   Resp 17   SpO2 96%   Visual Acuity Right Eye Distance:   Left Eye Distance:   Bilateral Distance:    Right Eye Near:   Left Eye Near:    Bilateral Near:     Physical Exam Vitals and nursing note reviewed.  Constitutional:      Appearance: He is not ill-appearing.  HENT:     Head: Normocephalic and atraumatic.     Right Ear: Tympanic membrane and ear canal normal.     Left Ear: Tympanic membrane and ear canal normal.     Nose: Congestion present. No rhinorrhea.     Mouth/Throat:      Mouth: Mucous membranes are moist.     Pharynx: Oropharynx is clear. No oropharyngeal exudate or posterior oropharyngeal erythema.  Eyes:     Conjunctiva/sclera: Conjunctivae normal.  Cardiovascular:     Rate and Rhythm: Normal rate and regular rhythm.     Heart sounds: Normal heart sounds.  Pulmonary:     Effort: Pulmonary effort is normal. No respiratory distress.     Breath sounds: Normal breath sounds. No wheezing or rales.  Musculoskeletal:     Cervical back: Neck supple.  Lymphadenopathy:     Cervical: No cervical adenopathy.  Skin:    General: Skin is warm.     Findings: Rash present.  Neurological:     Mental Status: He is alert and oriented to person, place, and time.  Psychiatric:        Mood and Affect: Mood normal.      UC Treatments / Results  Labs (all labs ordered are listed, but only abnormal results are displayed) Labs Reviewed - No data to display  EKG   Radiology No results found.  Procedures Procedures (including critical care time)  Medications Ordered in UC Medications - No data to display  Initial Impression / Assessment and Plan / UC Course  I have reviewed the triage vital signs and the nursing notes.  Pertinent labs & imaging results that were available during my care of the patient were reviewed by me and considered in my medical decision making (see chart for details).     43 year old male with cough for 5 days brown sputum No known contacts with illness, no fever, he is hypertensive at 155/106 otherwise well-appearing, afebrile, lungs are clear to auscultation without rales rhonchi or wheezing.  Discussed with patient viral illness, also has rash which he believes to be poison ivy. Rx Promethazine  DM for cough recommend stopping smoking, increase fluids.  Rx hydroxyzine  and triamcinolone  for rash.  Unseld to follow-up with PCP.  States has appointment for blood pressure recheck  Final Clinical Impressions(s) / UC Diagnoses    Final diagnoses:  None   Discharge Instructions   None    ED Prescriptions   None    PDMP not reviewed this encounter.   Zamya Culhane, [place] 07/28/23 1921

## 2023-07-28 NOTE — ED Triage Notes (Signed)
 Pt c/o cough since friday

## 2023-07-28 NOTE — Discharge Instructions (Signed)
 Follow up with your doctor

## 2023-08-17 ENCOUNTER — Ambulatory Visit: Payer: Medicaid Other | Admitting: Family Medicine

## 2023-08-17 ENCOUNTER — Encounter: Payer: Self-pay | Admitting: Family Medicine

## 2023-08-17 VITALS — BP 137/91 | HR 74 | Temp 98.7°F | Ht 67.0 in | Wt 195.1 lb

## 2023-08-17 DIAGNOSIS — J101 Influenza due to other identified influenza virus with other respiratory manifestations: Secondary | ICD-10-CM | POA: Diagnosis not present

## 2023-08-17 LAB — POCT INFLUENZA A/B
Influenza A, POC: POSITIVE — AB
Influenza B, POC: NEGATIVE

## 2023-08-17 LAB — POC COVID19 BINAXNOW: SARS Coronavirus 2 Ag: NEGATIVE

## 2023-08-17 MED ORDER — OSELTAMIVIR PHOSPHATE 75 MG PO CAPS: 75.0000 mg | ORAL_CAPSULE | Freq: Two times a day (BID) | ORAL | 0 refills | Status: AC

## 2023-08-17 MED ORDER — HYDROCODONE BIT-HOMATROP MBR 5-1.5 MG/5ML PO SOLN: 5.0000 mL | Freq: Every evening | ORAL | 0 refills | Status: DC | PRN

## 2023-08-17 NOTE — Patient Instructions (Addendum)
It was nice to see you today,  We addressed the following topics today: - You tested positive for influenza.  I have sent in a prescription for Tamiflu. -For symptomatic relief I would recommend the following: - For sore throat use Cepacol lozenges over-the-counter - For nasal congestion use over-the-counter nasal saline and Afrin/oxymetazoline as needed - Tylenol and ibuprofen for fever and aches and pains. - For nocturnal cough I will prescribe a cough syrup that contains an opioid.  Do not let anyone else use this, especially children. - No antibiotics needed at this time.  If your symptoms do not improve over the next week let us know, or if you feel like you are worsening.  Have a great day,  Frederic Jericho, MD

## 2023-08-17 NOTE — Assessment & Plan Note (Signed)
2 days of symptoms.  Positive test in the office.  Tamiflu prescribed, over-the-counter treatment options discussed and Hycodan cough syrup prescribed for nocturnal cough that has been keeping him awake.

## 2023-08-17 NOTE — Progress Notes (Signed)
   Acute Office Visit  Subjective:     Patient ID: Paul Newton, male    DOB: 11/26/1980, 43 y.o.   MRN: [id number]  Chief Complaint  Patient presents with   Cough    HPI Patient presents today with sore throat, dry cough, diaphoresis, diarrhea nausea headache nasal congestion going on for the last 2 days.  Nobody else in his family has been sick, but the rest of his family had the flu during Thanksgiving.  Patient had a positive flu test in the office.  Discussed Tamiflu and symptomatic management.  ROS      Objective:    BP (!) 137/91   Pulse 74   Temp 98.7 F (37.1 C)   Ht 5\' 7"  (1.702 m)   Wt 195 lb 1.9 oz (88.5 kg)   SpO2 96%   BMI 30.56 kg/m    Physical Exam General: Alert, oriented CV: Regular rhythm Pulmonary: Lungs clear bilaterally no wheezes or crackles.  Patient coughing Skin: Diaphoretic  No results found for any visits on 08/17/23.      Assessment & Plan:   Influenza A Assessment & Plan: 2 days of symptoms.  Positive test in the office.  Tamiflu prescribed, over-the-counter treatment options discussed and Hycodan cough syrup prescribed for nocturnal cough that has been keeping him awake.   Other orders -     Oseltamivir Phosphate; Take 1 capsule (75 mg total) by mouth 2 (two) times daily for 5 days.  Dispense: 10 capsule; Refill: 0 -     HYDROcodone Bit-Homatrop MBr; Take 5 mLs by mouth at bedtime as needed for cough.  Dispense: 120 mL; Refill: 0     Return if symptoms worsen or fail to improve.  Sandre Kitty, MD

## 2023-08-19 ENCOUNTER — Emergency Department (HOSPITAL_BASED_OUTPATIENT_CLINIC_OR_DEPARTMENT_OTHER)
Admission: EM | Admit: 2023-08-19 | Discharge: 2023-08-19 | Disposition: A | Discharge status: Home / Self Care | Payer: 59 | Attending: Emergency Medicine | Admitting: Emergency Medicine

## 2023-08-19 ENCOUNTER — Emergency Department (HOSPITAL_BASED_OUTPATIENT_CLINIC_OR_DEPARTMENT_OTHER): Payer: 59 | Admitting: Radiology

## 2023-08-19 ENCOUNTER — Other Ambulatory Visit: Payer: Self-pay

## 2023-08-19 DIAGNOSIS — R079 Chest pain, unspecified: Secondary | ICD-10-CM | POA: Diagnosis not present

## 2023-08-19 DIAGNOSIS — R059 Cough, unspecified: Secondary | ICD-10-CM | POA: Diagnosis not present

## 2023-08-19 DIAGNOSIS — J101 Influenza due to other identified influenza virus with other respiratory manifestations: Secondary | ICD-10-CM | POA: Diagnosis not present

## 2023-08-19 NOTE — ED Provider Notes (Signed)
[place] EM[facility]ENC[facility] DEPARTMENT AT [facility] Provider Note   CSN: [id number] Arrival date & time: 08/19/23  1721     History  No chief complaint on file.   Paul Newton is a 43 y.o. male presents today for cough and rib pain.  Patient was diagnosed with the flu 2 days ago and has had symptoms for 5 days total.  Patient is concerned for bronchitis/pneumonia.  Patient is that he feels like his nausea, vomiting, headache, chills, and bodyaches have resolved for the most part.  Patient still endorses congestion, runny nose, and productive cough.  HPI     Home Medications Prior to Admission medications   Medication Sig Start Date End Date Taking? Authorizing Provider  HYDROcodone bit-homatropine (HYCODAN) 5-1.5 MG/5ML syrup Take 5 mLs by mouth at bedtime as needed for cough. 08/17/23   Sandre Kitty, MD  hydrOXYzine (ATARAX) 25 MG tablet Take 1 tablet (25 mg total) by mouth every 8 (eight) hours as needed for itching. 07/28/23   Meliton Rattan, PA  oseltamivir (TAMIFLU) 75 MG capsule Take 1 capsule (75 mg total) by mouth 2 (two) times daily for 5 days. 08/17/23 08/22/23  Sandre Kitty, MD  triamcinolone cream (KENALOG) 0.1 % Apply 1 Application topically 2 (two) times daily. 07/28/23   Meliton Rattan, PA  dicyclomine (BENTYL) 20 MG tablet Take 1 tablet (20 mg total) by mouth 2 (two) times daily. 02/14/12 07/06/12  Chilton Si, PA-C      Allergies    Hyoscyamine    Review of Systems   Review of Systems  HENT:  Positive for congestion and rhinorrhea.   Respiratory:  Positive for cough.     Physical Exam Updated Vital Signs BP (!) 138/101   Pulse 64   Temp 98.1 F (36.7 C) (Oral)   Resp 18   Wt 88 kg   SpO2 97%   BMI 30.39 kg/m  Physical Exam Vitals and nursing note reviewed.  Constitutional:      General: He is not in acute distress.    Appearance: He is well-developed. He is not toxic-appearing or diaphoretic.  HENT:     Head: Normocephalic and  atraumatic.     Right Ear: External ear normal.     Left Ear: External ear normal.     Nose: Congestion and rhinorrhea present.     Mouth/Throat:     Mouth: Mucous membranes are moist.     Pharynx: No posterior oropharyngeal erythema.  Eyes:     Extraocular Movements: Extraocular movements intact.     Conjunctiva/sclera: Conjunctivae normal.  Cardiovascular:     Rate and Rhythm: Normal rate and regular rhythm.     Pulses: Normal pulses.     Heart sounds: Normal heart sounds. No murmur heard. Pulmonary:     Effort: Pulmonary effort is normal. No respiratory distress.     Breath sounds: Normal breath sounds. No stridor. No wheezing, rhonchi or rales.  Chest:     Chest wall: No tenderness.  Abdominal:     Palpations: Abdomen is soft.     Tenderness: There is no abdominal tenderness.  Musculoskeletal:        General: No swelling.     Cervical back: Normal range of motion and neck supple.  Skin:    General: Skin is warm and dry.     Capillary Refill: Capillary refill takes less than 2 seconds.  Neurological:     General: No focal deficit present.     Mental  Status: He is alert.     Motor: No weakness.  Psychiatric:        Mood and Affect: Mood normal.     ED Results / Procedures / Treatments   Labs (all labs ordered are listed, but only abnormal results are displayed) Labs Reviewed - No data to display  EKG None  Radiology DG Chest 2 View Result Date: 08/19/2023 CLINICAL DATA:  Chest pain and cough. EXAM: CHEST - 2 VIEW COMPARISON:  Chest radiograph dated 02/16/2022 FINDINGS: No focal consolidation, pleural effusion, or pneumothorax. The cardiac silhouette is within normal limits. No acute osseous pathology. IMPRESSION: No active cardiopulmonary disease. Electronically Signed   By: Elgie Collard M.D.   On: 08/19/2023 18:55    Procedures Procedures    Medications Ordered in ED Medications - No data to display  ED Course/ Medical Decision Making/ A&P                                  Medical Decision Making Amount and/or Complexity of Data Reviewed Radiology: ordered.   This patient presents to the ED with chief complaint(s) of cough with pertinent past medical history of influenza A which further complicates the presenting complaint. The complaint involves an extensive differential diagnosis and also carries with it a high risk of complications and morbidity.    The differential diagnosis includes pneumonia, bronchitis, influenza A  Additional history obtained: Records reviewed Care Everywhere/External Records  ED Course and Reassessment:   Independent visualization of imaging: - I independently visualized the following imaging with scope of interpretation limited to determining acute life threatening conditions related to emergency care: Chest x-ray, which revealed no active cardiopulmonary disease.  Consultation: - Consulted or discussed management/test interpretation w/ external professional: None  Consideration for admission or further workup: Considered for admission or further workup however patient's vital signs, physical exam, and labs were reassuring.  Patient symptoms likely due to influenza A infection.  Patient should take over-the-counter cough medication such as Mucinex or Robitussin.  Patient may also take Tylenol/Motrin as needed for pain and fever.  Patient should follow-up with primary care if his symptoms persist.        Final Clinical Impression(s) / ED Diagnoses Final diagnoses:  Influenza A    Rx / DC Orders ED Discharge Orders     None         Gretta Began 08/19/23 2034    Rondel Baton, MD 08/23/23 [phone]

## 2023-08-19 NOTE — ED Triage Notes (Signed)
Flu 5 days now. Continued cough and rib pain. Concerned for bronchitis/pneumonia.

## 2023-08-19 NOTE — Discharge Instructions (Addendum)
Today you were seen for cough.  I suspect that you are still dealing with symptoms from your influenza A infection.  The symptoms may take up to 10 to 14 days to resolve.  You may take over-the-counter cough medication such as Mucinex or Robitussin for cough.  You should take Tylenol or Motrin as needed for pain and fever.  Please follow-up with your primary care physician if your symptoms persist.  Thank you for letting us treat you today. After performing a physical exam and reviewing your imaging, I feel you are safe to go home. Please follow up with your PCP in the next several days and provide them with your records from this visit. Return to the Emergency Room if pain becomes severe or symptoms worsen.

## 2023-08-29 ENCOUNTER — Telehealth: Payer: 59

## 2023-09-08 ENCOUNTER — Ambulatory Visit: Payer: Medicaid Other | Admitting: Family Medicine

## 2023-09-10 ENCOUNTER — Encounter: Payer: Self-pay | Admitting: Family Medicine

## 2023-09-10 ENCOUNTER — Ambulatory Visit: Payer: Medicaid Other | Admitting: Family Medicine

## 2023-09-10 VITALS — BP 158/89 | HR 92 | Ht 67.0 in | Wt 207.0 lb

## 2023-09-10 DIAGNOSIS — E785 Hyperlipidemia, unspecified: Secondary | ICD-10-CM | POA: Insufficient documentation

## 2023-09-10 DIAGNOSIS — E782 Mixed hyperlipidemia: Secondary | ICD-10-CM | POA: Diagnosis not present

## 2023-09-10 DIAGNOSIS — R5383 Other fatigue: Secondary | ICD-10-CM | POA: Diagnosis not present

## 2023-09-10 DIAGNOSIS — I10 Essential (primary) hypertension: Secondary | ICD-10-CM

## 2023-09-10 MED ORDER — VALSARTAN 40 MG PO TABS: 40.0000 mg | ORAL_TABLET | Freq: Every day | ORAL | 0 refills | Status: DC

## 2023-09-10 NOTE — Progress Notes (Signed)
   Established Patient Office Visit  Subjective   Patient ID: Paul Newton, male    DOB: 24-Nov-1980  Age: 43 y.o. MRN: [id number]  Chief Complaint  Patient presents with   Medical Management of Chronic Issues    HPI  Patient states that he is getting better from his recent influenza infection.  Still has a mild cough.  We talked about the patient's fatigue and his negative blood testing.  Discussed obstructive sleep apnea is a possibility.  Patient also has a poor diet that can be contributing to his symptoms.  We talked about the patient's elevated cholesterol and blood pressure.  The patient is a [employer] and eats a lot of prepackaged food at [employer] or [employer].  We talked about limiting salt in the diet as well as dietary cholesterol.  Discussed other lifestyle modifications including weight loss, exercise, smoking cessation.  Patient is agreeable to starting a blood pressure medication today.    The 10-year ASCVD risk score (Arnett DK, et al., 2019) is: 11.8%  Health Maintenance Due  Topic Date Due   HIV Screening  Never done   COVID-19 Vaccine (1 - 2024-25 season) Never done      Objective:     BP (!) 158/89   Pulse 92   Ht 5\' 7"  (1.702 m)   Wt 207 lb (93.9 kg)   SpO2 98%   BMI 32.42 kg/m    Physical Exam General: Alert, oriented Pulmonary: No respiratory distress Psych: Pleasant affect   No results found for any visits on 09/10/23.      Assessment & Plan:   Other fatigue Assessment & Plan: Negative for anemia, normal TSH and testosterone level.  Patient endorses snoring.  Has positive STOP-BANG score.  Will send in order for home sleep study.  Orders: -     Home sleep test  Primary hypertension Assessment & Plan: Patient agreeable to starting blood pressure medication.  Will start valsartan.  See back in 1 month.  Recheck kidney function at that time and adjust as needed.   Mixed hyperlipidemia Assessment &  Plan: Elevated total and LDL cholesterol.  Discussed dietary modification, weight loss, exercise, smoking cessation.  Recheck in 6 to 12 months.   Other orders -     Valsartan; Take 1 tablet (40 mg total) by mouth daily.  Dispense: 90 tablet; Refill: 0     Return in about 4 weeks (around 10/08/2023) for HTN.    Sandre Kitty, MD

## 2023-09-10 NOTE — Patient Instructions (Addendum)
 It was nice to see you today,  We addressed the following topics today: -I am starting a blood pressure medicine called valsartan.  You will take this once a day.  Let us know if you have any issues with taking this medicine - I have ordered the sleep study.  Someone should call you to set this up.  If you have not heard from someone by the next time I see you let us know. - Try to limit your saturated fat intake as well as your foods that are high in sodium.  Try to limit your fast food intake as much as possible and replace it with healthier options preferably homemade options or fresh fruit or vegetables.  If you feel like you need something to snack on you can try something like blueberries, apple or other fresh fruit.  Once his repeat  Have a great day,  Frederic Jericho, MD

## 2023-09-10 NOTE — Assessment & Plan Note (Signed)
 Elevated total and LDL cholesterol.  Discussed dietary modification, weight loss, exercise, smoking cessation.  Recheck in 6 to 12 months.

## 2023-09-10 NOTE — Assessment & Plan Note (Signed)
 Patient agreeable to starting blood pressure medication.  Will start valsartan.  See back in 1 month.  Recheck kidney function at that time and adjust as needed.

## 2023-09-10 NOTE — Assessment & Plan Note (Signed)
 Negative for anemia, normal TSH and testosterone level.  Patient endorses snoring.  Has positive STOP-BANG score.  Will send in order for home sleep study.

## 2023-10-12 ENCOUNTER — Ambulatory Visit: Payer: 59 | Admitting: Family Medicine

## 2023-10-12 NOTE — Progress Notes (Deleted)
   Established Patient Office Visit  Subjective   Patient ID: Paul Newton, male    DOB: [birth year]  Age: 43 y.o. MRN: [id number]  No chief complaint on file.   HPI  HTN - valsartan started  Fatigue - sleep study received?    The 10-year ASCVD risk score (Arnett DK, et al., 2019) is: 11.8%  Health Maintenance Due  Topic Date Due   Pneumococcal Vaccine 63-79 Years old (1 of 2 - PCV) Never done   HIV Screening  Never done   COVID-19 Vaccine (1 - 2024-25 season) Never done      Objective:     There were no vitals taken for this visit. {Vitals History (Optional):23777}  Physical Exam   No results found for any visits on 10/12/23.      Assessment & Plan:   There are no diagnoses linked to this encounter.   No follow-ups on file.    Sandre Kitty, MD

## 2024-03-29 DIAGNOSIS — H9212 Otorrhea, left ear: Secondary | ICD-10-CM | POA: Diagnosis not present

## 2024-03-29 DIAGNOSIS — H9012 Conductive hearing loss, unilateral, left ear, with unrestricted hearing on the contralateral side: Secondary | ICD-10-CM | POA: Diagnosis not present

## 2024-03-29 DIAGNOSIS — Z9622 Myringotomy tube(s) status: Secondary | ICD-10-CM | POA: Diagnosis not present

## 2024-04-10 ENCOUNTER — Ambulatory Visit: Payer: Self-pay

## 2024-04-10 ENCOUNTER — Emergency Department

## 2024-04-10 ENCOUNTER — Other Ambulatory Visit: Payer: Self-pay

## 2024-04-10 ENCOUNTER — Observation Stay
Admission: EM | Admit: 2024-04-10 | Discharge: 2024-04-11 | Disposition: A | Discharge status: Home / Self Care | Attending: Student in an Organized Health Care Education/Training Program | Admitting: Student in an Organized Health Care Education/Training Program

## 2024-04-10 ENCOUNTER — Observation Stay

## 2024-04-10 DIAGNOSIS — G459 Transient cerebral ischemic attack, unspecified: Principal | ICD-10-CM

## 2024-04-10 DIAGNOSIS — R079 Chest pain, unspecified: Secondary | ICD-10-CM | POA: Diagnosis not present

## 2024-04-10 DIAGNOSIS — Z7982 Long term (current) use of aspirin: Secondary | ICD-10-CM | POA: Diagnosis not present

## 2024-04-10 DIAGNOSIS — R29818 Other symptoms and signs involving the nervous system: Secondary | ICD-10-CM | POA: Diagnosis not present

## 2024-04-10 DIAGNOSIS — R29898 Other symptoms and signs involving the musculoskeletal system: Secondary | ICD-10-CM | POA: Diagnosis not present

## 2024-04-10 DIAGNOSIS — I700 Atherosclerosis of aorta: Secondary | ICD-10-CM | POA: Insufficient documentation

## 2024-04-10 DIAGNOSIS — R0789 Other chest pain: Secondary | ICD-10-CM | POA: Diagnosis not present

## 2024-04-10 DIAGNOSIS — Z79899 Other long term (current) drug therapy: Secondary | ICD-10-CM | POA: Insufficient documentation

## 2024-04-10 DIAGNOSIS — I639 Cerebral infarction, unspecified: Secondary | ICD-10-CM | POA: Diagnosis present

## 2024-04-10 DIAGNOSIS — J439 Emphysema, unspecified: Secondary | ICD-10-CM | POA: Insufficient documentation

## 2024-04-10 DIAGNOSIS — R0989 Other specified symptoms and signs involving the circulatory and respiratory systems: Secondary | ICD-10-CM | POA: Diagnosis not present

## 2024-04-10 DIAGNOSIS — I6350 Cerebral infarction due to unspecified occlusion or stenosis of unspecified cerebral artery: Secondary | ICD-10-CM

## 2024-04-10 DIAGNOSIS — R29701 NIHSS score 1: Secondary | ICD-10-CM

## 2024-04-10 DIAGNOSIS — I6601 Occlusion and stenosis of right middle cerebral artery: Secondary | ICD-10-CM | POA: Diagnosis not present

## 2024-04-10 DIAGNOSIS — I10 Essential (primary) hypertension: Secondary | ICD-10-CM | POA: Diagnosis not present

## 2024-04-10 DIAGNOSIS — F17210 Nicotine dependence, cigarettes, uncomplicated: Secondary | ICD-10-CM | POA: Diagnosis not present

## 2024-04-10 DIAGNOSIS — R918 Other nonspecific abnormal finding of lung field: Secondary | ICD-10-CM | POA: Diagnosis not present

## 2024-04-10 DIAGNOSIS — R531 Weakness: Secondary | ICD-10-CM | POA: Diagnosis not present

## 2024-04-10 DIAGNOSIS — Z7901 Long term (current) use of anticoagulants: Secondary | ICD-10-CM | POA: Insufficient documentation

## 2024-04-10 DIAGNOSIS — I6622 Occlusion and stenosis of left posterior cerebral artery: Secondary | ICD-10-CM | POA: Diagnosis not present

## 2024-04-10 LAB — COMPREHENSIVE METABOLIC PANEL WITH GFR
ALT: 19 U/L (ref 0–44)
AST: 21 U/L (ref 15–41)
Albumin: 4.3 g/dL (ref 3.5–5.0)
Alkaline Phosphatase: 66 U/L (ref 38–126)
Anion gap: 11 (ref 5–15)
BUN: 14 mg/dL (ref 6–20)
CO2: 21 mmol/L — ABNORMAL LOW (ref 22–32)
Calcium: 9.1 mg/dL (ref 8.9–10.3)
Chloride: 107 mmol/L (ref 98–111)
Creatinine, Ser: 1.09 mg/dL (ref 0.61–1.24)
GFR, Estimated: >60 mL/min (ref >60)
Glucose, Bld: 88 mg/dL (ref 70–99)
Potassium: 3.7 mmol/L (ref 3.5–5.1)
Sodium: 139 mmol/L (ref 135–145)
Total Bilirubin: 0.7 mg/dL (ref 0.0–1.2)
Total Protein: 8 g/dL (ref 6.5–8.1)

## 2024-04-10 LAB — CBG MONITORING, ED: Glucose-Capillary: 98 mg/dL (ref 70–99)

## 2024-04-10 LAB — CBC WITH DIFFERENTIAL/PLATELET
Abs Immature Granulocytes: 0.04 K/uL (ref 0.00–0.07)
Basophils Absolute: 0.1 K/uL (ref 0.0–0.1)
Basophils Relative: 1 %
Eosinophils Absolute: 0.1 K/uL (ref 0.0–0.5)
Eosinophils Relative: 1 %
HCT: 47.5 % (ref 39.0–52.0)
Hemoglobin: 16 g/dL (ref 13.0–17.0)
Immature Granulocytes: 0 %
Lymphocytes Relative: 26 %
Lymphs Abs: 2.9 K/uL (ref 0.7–4.0)
MCH: 31.8 pg (ref 26.0–34.0)
MCHC: 33.7 g/dL (ref 30.0–36.0)
MCV: 94.4 fL (ref 80.0–100.0)
Monocytes Absolute: 0.8 K/uL (ref 0.1–1.0)
Monocytes Relative: 7 %
Neutro Abs: 7.3 K/uL (ref 1.7–7.7)
Neutrophils Relative %: 65 %
Platelets: 264 K/uL (ref 150–400)
RBC: 5.03 MIL/uL (ref 4.22–5.81)
RDW: 13.1 % (ref 11.5–15.5)
WBC: 11.1 K/uL — ABNORMAL HIGH (ref 4.0–10.5)
nRBC: 0 % (ref 0.0–0.2)

## 2024-04-10 LAB — URINE DRUG SCREEN, QUALITATIVE (ARMC ONLY)
Amphetamines, Ur Screen: NOT DETECTED
Barbiturates, Ur Screen: NOT DETECTED
Benzodiazepine, Ur Scrn: NOT DETECTED
Cannabinoid 50 Ng, Ur ~~LOC~~: POSITIVE — AB
Cocaine Metabolite,Ur ~~LOC~~: NOT DETECTED
MDMA (Ecstasy)Ur Screen: NOT DETECTED
Methadone Scn, Ur: NOT DETECTED
Opiate, Ur Screen: NOT DETECTED
Phencyclidine (PCP) Ur S: NOT DETECTED
Tricyclic, Ur Screen: NOT DETECTED

## 2024-04-10 LAB — TSH: TSH: 1.457 u[IU]/mL (ref 0.350–4.500)

## 2024-04-10 LAB — CREATININE, SERUM
Creatinine, Ser: 1.06 mg/dL (ref 0.61–1.24)
GFR, Estimated: >60 mL/min (ref >60)

## 2024-04-10 LAB — APTT: aPTT: 30 s (ref 24–36)

## 2024-04-10 LAB — TROPONIN I (HIGH SENSITIVITY)
Troponin I (High Sensitivity): <2 ng/L (ref <18)
Troponin I (High Sensitivity): 2 ng/L (ref <18)

## 2024-04-10 LAB — PROTIME-INR
INR: 1 (ref 0.8–1.2)
Prothrombin Time: 13.9 s (ref 11.4–15.2)

## 2024-04-10 LAB — HEMOGLOBIN A1C
Hgb A1c MFr Bld: 4.8 % (ref 4.8–5.6)
Mean Plasma Glucose: 91.06 mg/dL

## 2024-04-10 LAB — ETHANOL: Alcohol, Ethyl (B): <15 mg/dL (ref <15)

## 2024-04-10 MED ORDER — ENOXAPARIN SODIUM 40 MG/0.4ML IJ SOSY
40.0000 mg | PREFILLED_SYRINGE | INTRAMUSCULAR | Status: DC
Start: 2024-04-10 — End: 2024-04-10

## 2024-04-10 MED ORDER — ACETAMINOPHEN 650 MG RE SUPP: 650.0000 mg | RECTAL | Status: DC | PRN

## 2024-04-10 MED ORDER — IOHEXOL 350 MG/ML SOLN
100.0000 mL | Freq: Once | INTRAVENOUS | Status: AC | PRN
  Administered 2024-04-10: 100 mL via INTRAVENOUS

## 2024-04-10 MED ORDER — ACETAMINOPHEN 325 MG PO TABS
650.0000 mg | ORAL_TABLET | ORAL | Status: DC | PRN
  Filled 2024-04-10: qty 2

## 2024-04-10 MED ORDER — SODIUM CHLORIDE 0.9% FLUSH
3.0000 mL | Freq: Once | INTRAVENOUS | Status: AC
  Administered 2024-04-10: 3 mL via INTRAVENOUS

## 2024-04-10 MED ORDER — STROKE: EARLY STAGES OF RECOVERY BOOK
Freq: Once | Status: AC
Start: 2024-04-11 — End: 2024-04-11

## 2024-04-10 MED ORDER — ACETAMINOPHEN 160 MG/5ML PO SOLN: 650.0000 mg | ORAL | Status: DC | PRN

## 2024-04-10 MED ORDER — ENOXAPARIN SODIUM 60 MG/0.6ML IJ SOSY
0.5000 mg/kg | PREFILLED_SYRINGE | INTRAMUSCULAR | Status: DC
  Filled 2024-04-10: qty 0.6

## 2024-04-10 NOTE — Progress Notes (Signed)
 CODE STROKE- PHARMACY COMMUNICATION   Time CODE STROKE called/page received:1625  Time response to CODE STROKE was made (in person or via phone): 1627  Time Stroke Kit retrieved from [employer] (only if needed): N/A- too mild to treat per MD  Name of Provider/Nurse contacted: Rosemarie  Past Medical History:  Diagnosis Date   Complication of anesthesia    slow to wake up   COVID 2020   mild case   GERD (gastroesophageal reflux disease)    Pneumonia    Prior to Admission medications   Medication Sig Start Date End Date Taking? Authorizing Provider  valsartan  (DIOVAN ) 40 MG tablet Take 1 tablet (40 mg total) by mouth daily. 09/10/23   Chandra Toribio POUR, MD  dicyclomine  (BENTYL ) 20 MG tablet Take 1 tablet (20 mg total) by mouth 2 (two) times daily. 02/14/12 07/06/12  Nguyen, Brigitte S, PA-C    Ted Goodner H Kumiko Fishman ,PharmD Clinical Pharmacist  04/10/2024  4:26 PM

## 2024-04-10 NOTE — H&P (Signed)
 History and Physical  Paul Newton:[id number] DOB: 05-Dec-1980 DOA: 04/10/2024 PCP: Paul Toribio [name], MD  Chief Complaint: weakness Historian: patient  HPI:  Paul Newton is a 43 y.o. male with a PMH significant for HTN, tobacco use, anxiety. At baseline, they live at home with their wife and are independent with their ADLs.  They presented from home to the ED on 04/10/2024 with acute onset chest pain, left arm weakness and left facial numbness. He was building a shed when he began having chest pain. Does not endorse SOB, diaphoresis. Within minutes, he began having left arm weakness and facial numbness. He and his wife deny noticing any abnormal speech or facial asymmetry. He denies any memory changes and no swallowing issues. Denies balance abnormalities or lower extremity weakness.   In the ED, it was found that they had moderate hypertension and a headache but otherwise stable vital signs.  Significant findings included: unremarkable CMP. Negative troponins x2. Negative ethanol. Mild leukocytosis but otherwise normal CBC.  Chest xray: negative acute Head CT, CTA head and neck: negative acute.  Neurology was consulted and recommended brain MRI for suspected stroke.  ECG- no acute ST, t-wave changes  They were initially treated with nothing.   Patient was admitted to medicine service for further workup and management of weakness as outlined in detail below.  Assessment/Plan Principal Problem:   Stroke [facility])   Possible stroke- workup so far negative. Symptoms are improving but still has some facial numbness and arm weakness. Chest pain resolved.  - neurology has consulted and suspects small thalamic infarct  - MRI scan of the brain, echocardiogram, lipid profile, hemoglobin A1c, TSH.  - starting aspirin  and plavix , and statin - permissive htn Patient is not a candidate for thrombolysis  Tobacco use- counseling provided in ED - nicotine replacement PRN  HTN- patient was  previously prescribed valsartan  but never picked it up due to thinking his high blood pressure was only due to white-coat syndrome - titrate on treatment once permissive period has ended or if MRI is negative  Past Medical History:  Diagnosis Date   Complication of anesthesia    slow to wake up   COVID 2020   mild case   GERD (gastroesophageal reflux disease)    Pneumonia     Past Surgical History:  Procedure Laterality Date   CLOSED REDUCTION METACARPAL WITH PERCUTANEOUS PINNING Right 03/18/2022   Procedure: closed reduction percutaneous pinning right ring finger and right small finger metacarpal fracture;  Surgeon: Sissy Cough, MD;  Location: [facility] OR;  Service: Orthopedics;  Laterality: Right;  Axillary block/ mac   LYMPHADENECTOMY     Right neck R/T swelling with airway compromise during bout with pneumonia     reports that he has been smoking cigarettes. He has never used smokeless tobacco. He reports that he does not currently use alcohol. He reports that he does not currently use drugs.  Allergies  Allergen Reactions   Hyoscyamine Other (See Comments)    Felt like he couldn't swallow- made his throat unusually dry     Family History  Problem Relation Age of Onset   Arthritis Mother     Prior to Admission medications   Medication Sig Start Date End Date Taking? Authorizing Provider  valsartan  (DIOVAN ) 40 MG tablet Take 1 tablet (40 mg total) by mouth daily. 09/10/23   Paul Toribio [name], MD  dicyclomine  (BENTYL ) 20 MG tablet Take 1 tablet (20 mg total) by mouth 2 (two) times daily. 02/14/12  07/06/12  Leontine Thurnell RAMAN, PA-C   I have personally, briefly reviewed patient's prior medical records in [facility]  Objective: Blood pressure (!) 152/100, pulse 88, resp. rate 15, weight 89.6 kg, SpO2 99%.   Constitutional: NAD, calm, comfortable HEENT: lids and conjunctivae normal. MMM. Posterior pharynx clear of any exudate or lesions. Normal dentition.  Neck: normal,  supple, no masses, no thyromegaly Respiratory: CTAB, no wheezing, no crackles. Normal respiratory effort. No accessory muscle use.  Cardiovascular: RRR, no murmurs / rubs / gallops. No extremity edema. 2+ pedal pulses. no clubbing / cyanosis.  Abdomen: soft, NT, ND, no masses or HSM palpated. Musculoskeletal: No joint deformity upper and lower extremities. Normal muscle tone.  Skin: dry, intact, normal color, normal temperature on exposed skin Neurologic: Alert and oriented x 3. Normal speech. Grossly non-focal exam. PERRL Psychiatric: Normal mood. Congruent affect.  Labs on Admission: I have personally reviewed admission labs and imaging studies  CBC    Component Value Date/Time   WBC 11.1 (H) 04/10/2024 1624   RBC 5.03 04/10/2024 1624   HGB 16.0 04/10/2024 1624   HGB 16.8 07/22/2023 0939   HCT 47.5 04/10/2024 1624   HCT 49.2 07/22/2023 0939   PLT 264 04/10/2024 1624   PLT 286 07/22/2023 0939   MCV 94.4 04/10/2024 1624   MCV 96 07/22/2023 0939   MCV 94 09/19/2012 2059   MCH 31.8 04/10/2024 1624   MCHC 33.7 04/10/2024 1624   RDW 13.1 04/10/2024 1624   RDW 13.1 07/22/2023 0939   RDW 14.1 09/19/2012 2059   LYMPHSABS 2.9 04/10/2024 1624   LYMPHSABS 3.3 (H) 07/22/2023 0939   MONOABS 0.8 04/10/2024 1624   EOSABS 0.1 04/10/2024 1624   EOSABS 0.2 07/22/2023 0939   BASOSABS 0.1 04/10/2024 1624   BASOSABS 0.1 07/22/2023 0939   CMP     Component Value Date/Time   NA 139 04/10/2024 1624   NA 140 07/22/2023 0939   NA 137 09/19/2012 2059   K 3.7 04/10/2024 1624   K 4.0 09/19/2012 2059   CL 107 04/10/2024 1624   CL 105 09/19/2012 2059   CO2 21 (L) 04/10/2024 1624   CO2 28 09/19/2012 2059   GLUCOSE 88 04/10/2024 1624   GLUCOSE 82 09/19/2012 2059   BUN 14 04/10/2024 1624   BUN 12 07/22/2023 0939   BUN 16 09/19/2012 2059   CREATININE 1.09 04/10/2024 1624   CREATININE 1.17 09/19/2012 2059   CALCIUM  9.1 04/10/2024 1624   CALCIUM  9.0 09/19/2012 2059   PROT 8.0 04/10/2024 1624    PROT 7.2 07/22/2023 0939   PROT 8.4 (H) 09/19/2012 2059   ALBUMIN 4.3 04/10/2024 1624   ALBUMIN 4.4 07/22/2023 0939   ALBUMIN 4.3 09/19/2012 2059   AST 21 04/10/2024 1624   AST 27 09/19/2012 2059   ALT 19 04/10/2024 1624   ALT 34 09/19/2012 2059   ALKPHOS 66 04/10/2024 1624   ALKPHOS 81 09/19/2012 2059   BILITOT 0.7 04/10/2024 1624   BILITOT 0.7 07/22/2023 0939   BILITOT 0.4 09/19/2012 2059   GFRNONAA >60 04/10/2024 1624   GFRNONAA >60 09/19/2012 2059   GFRAA >60 03/27/2015 1741   GFRAA >60 09/19/2012 2059    Radiological Exams on Admission: DG Chest Port 1 View Result Date: 04/10/2024 CLINICAL DATA:  [id number] Chest pain [id number] EXAM: PORTABLE CHEST - 1 VIEW COMPARISON:  August 19, 2023 FINDINGS: No focal airspace consolidation, pleural effusion, or pneumothorax. No cardiomegaly.No acute fracture or destructive lesion. IMPRESSION: No acute cardiopulmonary abnormality. Electronically  Signed   By: Rogelia Myers M.D.   On: 04/10/2024 17:38   CT ANGIO CHEST AORTA W/CM &/OR WO/CM Result Date: 04/10/2024 CLINICAL DATA:  Dissecting aneurysm of the aorta. EXAM: CT ANGIOGRAPHY CHEST WITH CONTRAST TECHNIQUE: Multidetector CT imaging of the chest was performed using the standard protocol during bolus administration of intravenous contrast. Multiplanar CT image reconstructions and MIPs were obtained to evaluate the vascular anatomy. RADIATION DOSE REDUCTION: This exam was performed according to the departmental dose-optimization program which includes automated exposure control, adjustment of the mA and/or kV according to patient size and/or use of iterative reconstruction technique. CONTRAST:  OMNIPAQUE  IOHEXOL  350 MG/ML SOLN COMPARISON:  None Available. FINDINGS: Cardiovascular: Preferential opacification of the thoracic aorta. No evidence of thoracic aortic aneurysm or dissection. Normal heart size. No pericardial effusion. Mediastinum/Nodes: No enlarged mediastinal, hilar, or axillary lymph  nodes. Thyroid  gland, trachea, and esophagus demonstrate no significant findings. Lungs/Pleura: Mild emphysema present. The lungs are otherwise clear. There is no pleural effusion or pneumothorax. There is a 2 mm nodule in the right lower lobe image 7/94. Upper Abdomen: No acute abnormality. Musculoskeletal: No chest wall abnormality. No acute or significant osseous findings. Review of the MIP images confirms the above findings. IMPRESSION: 1. No evidence for aortic dissection or aneurysm. 2. Mild emphysema. 3. Right solid pulmonary nodule measuring 2 mm. No follow-up needed if patient is low-risk.This recommendation follows the consensus statement: Guidelines for Management of Incidental Pulmonary Nodules Detected on CT Images: From the Fleischner Society 2017; Radiology 2017; 284:228-243. Emphysema (ICD10-J43.9). Electronically Signed   By: Greig Pique M.D.   On: 04/10/2024 17:14   CT ANGIO HEAD NECK W WO CM (CODE STROKE) Result Date: 04/10/2024 CLINICAL DATA:  Provided history: Neuro deficit, acute, stroke suspected. EXAM: CT ANGIOGRAPHY HEAD AND NECK WITH AND WITHOUT CONTRAST TECHNIQUE: Multidetector CT imaging of the head and neck was performed using the standard protocol during bolus administration of intravenous contrast. Multiplanar CT image reconstructions and MIPs were obtained to evaluate the vascular anatomy. Carotid stenosis measurements (when applicable) are obtained utilizing NASCET criteria, using the distal internal carotid diameter as the denominator. RADIATION DOSE REDUCTION: This exam was performed according to the departmental dose-optimization program which includes automated exposure control, adjustment of the mA and/or kV according to patient size and/or use of iterative reconstruction technique. CONTRAST:  OMNIPAQUE  IOHEXOL  350 MG/ML SOLN COMPARISON:  Non-contrast head CT performed earlier today 04/10/2024. FINDINGS: CTA NECK FINDINGS Aortic arch: Standard aortic branching.  Atherosclerotic plaque within visible portions of the aortic arch. No hemodynamically significant innominate or proximal subclavian artery stenosis. Right carotid system: CCA and ICA patent within the neck without stenosis or significant atherosclerotic disease. Left carotid system: CCA and ICA patent within the neck without stenosis or significant atherosclerotic disease. Vertebral arteries: Codominant and patent within the neck without stenosis or significant atherosclerotic disease. Skeleton: Cervical spondylosis. No acute fracture or aggressive osseous lesion. Other neck: No neck mass or cervical lymphadenopathy. Upper chest: No consolidation within the imaged lung apices. Biapical paraseptal emphysema. Review of the MIP images confirms the above findings CTA HEAD FINDINGS Anterior circulation: The intracranial internal carotid arteries are patent. The M1 middle cerebral arteries are patent. No M2 proximal branch occlusion is identified. Severe stenosis within a proximal-to-mid M2 right MCA branch (series 11, image 12). The anterior cerebral arteries are patent. No intracranial aneurysm is identified. Posterior circulation: The intracranial vertebral arteries are patent. The basilar artery is patent. The posterior cerebral arteries are patent. Posterior  communicating arteries are diminutive or absent, bilaterally. Moderate-to-severe stenosis within the left posterior cerebral artery P2 segment (series 11, image 22). Venous sinuses: Within the limitations of contrast timing, no convincing thrombus. Anatomic variants: As described. Review of the MIP images confirms the above findings No emergent large vessel occlusion identified. These results were communicated to Dr. Rosemarie at 5:08 pmon 9/22/2025by text page via the [facility] messaging system. IMPRESSION: CTA neck: 1. The common carotid, internal carotid and vertebral arteries are patent within the neck without stenosis or significant atherosclerotic disease. 2.  Aortic Atherosclerosis (ICD10-I70.0) and Emphysema (ICD10-J43.9). CTA head: 1. No proximal intracranial large vessel occlusion identified. 2. 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 3. Moderate-to-severe stenosis within the left posterior cerebral artery P2 segment. Electronically Signed   By: Rockey Childs D.O.   On: 04/10/2024 17:09   CT HEAD CODE STROKE WO CONTRAST Result Date: 04/10/2024 CLINICAL DATA:  Code stroke. Neuro deficit, acute, stroke suspected. EXAM: CT HEAD WITHOUT CONTRAST TECHNIQUE: Contiguous axial images were obtained from the base of the skull through the vertex without intravenous contrast. RADIATION DOSE REDUCTION: This exam was performed according to the departmental dose-optimization program which includes automated exposure control, adjustment of the mA and/or kV according to patient size and/or use of iterative reconstruction technique. COMPARISON:  Head CT 04/30/2021. FINDINGS: Brain: No age-advanced or lobar predominant cerebral atrophy. There is no acute intracranial hemorrhage. No demarcated cortical infarct. No extra-axial fluid collection. No evidence of an intracranial mass. No midline shift. Vascular: No hyperdense vessel. Skull: No calvarial fracture or aggressive osseous lesion. Sinuses/Orbits: No mass or acute finding within the imaged orbits. No significant paranasal sinus disease at the imaged levels. ASPECTS [facility] Stroke Program Early CT Score) - Ganglionic level infarction (caudate, lentiform nuclei, internal capsule, insula, M1-M3 cortex): 7 - Supraganglionic infarction (M4-M6 cortex): 3 Total score (0-10 with 10 being normal): 10 No evidence of an acute intracranial abnormality. These results were communicated to Dr. Rosemarie at 4:50 pmon 9/22/2025by text page via the [facility] messaging system. IMPRESSION: No evidence of an acute intracranial abnormality. Electronically Signed   By: Rockey Childs D.O.   On: 04/10/2024 16:50   EKG:  Independently reviewed. NSR HR 79  DVT prophylaxis: enoxaparin  (LOVENOX ) injection 40 mg Start: 04/10/24 1800   Code Status: full  Family Communication: wife at bedside   Disposition Plan: med-tele obs admission   Consults called: neurology    Marien LITTIE Piety, DO [facility]  04/10/2024, 6:53 PM    To contact the appropriate [facility] Attending or Consulting provider: Check [email or web address] for coverage from 7pm-7am

## 2024-04-10 NOTE — ED Triage Notes (Signed)
 Pt arrives via POV. PT reports chest pain and sob that started around 1400. Pt reports numbness and tingling to left and left side of face. He does have decreased sensation to left side of his face and arm. Code stroke activated per Dr. Dorothyann.

## 2024-04-10 NOTE — Progress Notes (Signed)
 [facility] Telemedicine Consult   Requesting Provider: Dr Eveline Consult Participants: Patient, bedside RN,  Location of the provider: [facility] Location of the patient: [facility] ED  This consult was provided via telemedicine with 2-way video and audio communication. The patient/family was informed that care would be provided in this way and agreed to receive care in this manner.    Chief Complaint: Left face and upper extremity numbness  HPI:  [name] is a 43 year old male with no significant past medical history except chronic left ear infection status post myringotomy.  He takes no blood thinners.  He presented with sudden onset of chest pain as well as some mild shortness of breath at 2 PM.  About an hour later his pain persisted but he noticed numbness involving lower half of the left face as well as left upper extremity which has persisted since then.  Denies significant weakness, headache, diplopia, blurred speech gait or balance problems.  Denies any prior history of stroke TIA seizures or significant neurological problems.  No significant risk factors except smoking cigarettes, using marijuana and mild obesity CT head noncontrast study personally reviewed showed no acute abnormality. CT angiogram brain and neck pending at this time LKW: 3 pm tnk given?: No, too mild to treat IR Thrombectomy? No, clinically not suggestive of LVO Modified Rankin Scale: 0-Completely asymptomatic and back to baseline post- stroke Time of teleneurologist evaluation: 1625  Exam: Vitals:   04/10/24 1626  BP: (!) 162/108  Pulse: 99  Resp: 17  SpO2: 99%    General: Pleasant middle-age male not in distress. Patient is awake alert oriented to time place and person.  Speech and language appear normal. 1A: Level of Consciousness - 0 1B: Ask Month and Age - 0 1C: 'Blink Eyes' & 'Squeeze Hands' - 0 2: Test Horizontal Extraocular Movements - 0 3: Test Visual Fields -  0 4: Test Facial Palsy - 0 5A: Test Left Arm Motor Drift - 0 5B: Test Right Arm Motor Drift - 0 6A: Test Left Leg Motor Drift - 0 6B: Test Right Leg Motor Drift - 0 7: Test Limb Ataxia - 0 8: Test Sensation - 1 left lower face and upper extremity decreased sensation 9: Test Language/Aphasia- 0 10: Test Dysarthria - 0 11: Test Extinction/Inattention - 0 NIHSS score: 1   Imaging Reviewed: CT head without contrast shows no acute abnormality.  Aspect score of 10.  Labs reviewed in epic and pertinent values follow: Pending   Assessment: 43 year old male with sudden onset of left face and upper extremity numbness and paresthesias preceded earlier by some substernal chest pain and discomfort likely from small right hemispheric subcortical infarct.  Given onset with chest pain and substernal discomfort full do CT angiogram of the chest to rule out dissection as well as check EKG and troponin for cardiac ischemia.  Patient neurological deficits are too mild to treat with IV thrombolysis at this time.  Recommendations: Check CT angiogram of the chest to rule out dissection as well as CT angiogram of brain and neck to evaluate cerebrovascular vasculature.   If patient is found to have aortic dissection kindly consult vascular surgery and bring to ICU.  CT angiograms are negative.  He likely has a small thalamic infarct and needs to be admitted for further stroke workup to the medical hospitalist team.  Will need MRI scan of the brain, echocardiogram, lipid profile, hemoglobin A1c.  Will need dual antiplatelet therapy for 3 weeks followed by aspirin  and  aggressive risk factor modification.  Patient is not a candidate for thrombolysis due to mild deficits which are nondisabling at this time and risk-benefit of TNK is not in favor of using it.SABRA  However patient's neurological deficit get worse within 4.5 hr time window since onset  reactivate code stroke and may reconsider decision on thrombolysis. Patient  advised to quit smoking cigarettes and marijuana and is agreeable to do so. Discussed with patient and Dr.Paduchowski ER MD., patient and his wife at bedside.   This patient is receiving care for possible acute neurological changes. There was 55 minutes of care by this provider at the time of service, including time for direct evaluation via telemedicine, review of medical records, imaging studies and discussion of findings with providers, the patient and/or family.  Eather Popp MD [facility] [phone]  If 7pm- 7am, please page neurology on call as listed in AMION.

## 2024-04-10 NOTE — ED Notes (Signed)
 Teleneurologist online and examining patient.

## 2024-04-10 NOTE — ED Notes (Signed)
 Code  stroke  called  to [facility]  at  420pm  spoke to  [employer]

## 2024-04-10 NOTE — Progress Notes (Signed)
   04/10/24 1630  Spiritual Encounters  Type of Visit Attempt (pt unavailable)  Care provided to: Pt not available  Conversation partners present during encounter Nurse  Reason for visit Code  OnCall Visit Yes   Chaplain responded to a Code Stroke and patient was in CT.  Chaplain handed off to overnight Chaplain who checked to see if patient's family was present.    Rev. Rana M. Nicholaus, M.Div. Chaplain Resident [facility]

## 2024-04-10 NOTE — Telephone Encounter (Signed)
 FYI Only or Action Required?: Action required by provider: request for appointment.  Patient was last seen in primary care on 09/10/2023 by Chandra Toribio POUR, MD.  Called Nurse Triage reporting Chest Pain.  Symptoms began today.  Interventions attempted: Nothing.  Symptoms are: unchanged. Chest pain on and off. Last a few minutes. Feels jittery. Has tingling in left arm. Pain mild.  Triage Disposition: Go to ED Now (or PCP Triage)  Patient/caregiver understands and will follow disposition?: Yes   Copied from CRM #[id number]. Topic: Clinical - Red Word Triage >> Apr 10, 2024  3:27 PM Fonda T wrote: Red Word that prompted transfer to Nurse Triage: Patient reports he is having some dizziness, and left arm tingling, with some chest discomfort. Reason for Disposition  Patient sounds very sick or weak to the triager  Answer Assessment - Initial Assessment Questions 1. LOCATION: Where does it hurt?       Middle, left 2. RADIATION: Does the pain go anywhere else? (e.g., into neck, jaw, arms, back)     Left arm feels tingly 3. ONSET: When did the chest pain begin? (Minutes, hours or days)      today 4. PATTERN: Does the pain come and go, or has it been constant since it started?  Does it get worse with exertion?      Comes and goes 5. DURATION: How long does it last (e.g., seconds, minutes, hours)    minutes 6. SEVERITY: How bad is the pain?  (e.g., Scale 1-10; mild, moderate, or severe)     Now - mild 7. CARDIAC RISK FACTORS: Do you have any history of heart problems or risk factors for heart disease? (e.g., angina, prior heart attack; diabetes, high blood pressure, high cholesterol, smoker, or strong family history of heart disease)     no 8. PULMONARY RISK FACTORS: Do you have any history of lung disease?  (e.g., blood clots in lung, asthma, emphysema, birth control pills)     no 9. CAUSE: What do you think is causing the chest pain?     unsure 10. OTHER SYMPTOMS:  Do you have any other symptoms? (e.g., dizziness, nausea, vomiting, sweating, fever, difficulty breathing, cough)       Feels jittery 11. PREGNANCY: Is there any chance you are pregnant? When was your last menstrual period?       N/a  Protocols used: Chest Pain-A-AH

## 2024-04-10 NOTE — ED Notes (Signed)
 Patient transported to CT

## 2024-04-10 NOTE — Progress Notes (Signed)
 PHARMACIST - PHYSICIAN COMMUNICATION  CONCERNING:  Enoxaparin  (Lovenox ) for DVT Prophylaxis    RECOMMENDATION: Patient was prescribed enoxaprin 40mg  q24 hours for VTE prophylaxis.   Filed Weights   04/10/24 1620  Weight: 89.6 kg (197 lb 8.5 oz)    Body mass index is 30.94 kg/m.  CrCl cannot be calculated (Unknown ideal weight.).   Based on [facility] policy patient is candidate for enoxaparin  0.5mg /kg TBW SQ every 24 hours based on BMI being >30.  DESCRIPTION: Pharmacy has adjusted enoxaparin  dose per [facility] policy.  Patient is now receiving enoxaparin  45 mg every 24 hours    Margareta Laureano Rodriguez-Guzman PharmD, BCPS 04/10/2024 7:17 PM

## 2024-04-10 NOTE — ED Provider Notes (Signed)
 [facility] Provider Note    Event Date/Time   First MD Initiated Contact with Patient 04/10/24 1631     (approximate)  History   Chief Complaint: Chest Pain and Code Stroke  HPI  KRYSTOFER HEVENER is a 43 y.o. male with a past medical history of gastric reflux, presents to the emergency department for acute onset of chest pain, left arm and face tingling/numbness.  According to the patient around 2 PM today he developed pain in the center of his chest shortly after followed by a numbness and tingling sensation in the left arm and left face.  Upon arrival patient met code stroke criteria and a code stroke was initiated.  Patient was seen by neurology.  During my evaluation patient states a very slight tingling sensation of the left arm and left face but the chest pain is largely resolved.  Denies any prior history of stroke or TIA.  No cardiac history.  Physical Exam   Triage Vital Signs: ED Triage Vitals  Encounter Vitals Group     BP 04/10/24 1626 (!) 162/108     Girls Systolic BP Percentile --      Girls Diastolic BP Percentile --      Boys Systolic BP Percentile --      Boys Diastolic BP Percentile --      Pulse Rate 04/10/24 1626 99     Resp 04/10/24 1626 17     Temp --      Temp src --      SpO2 04/10/24 1626 99 %     Weight 04/10/24 1620 197 lb 8.5 oz (89.6 kg)     Height --      Head Circumference --      Peak Flow --      Pain Score --      Pain Loc --      Pain Education --      Exclude from Growth Chart --     Most recent vital signs: Vitals:   04/10/24 1626 04/10/24 1700  BP: (!) 162/108 (!) 152/100  Pulse: 99 88  Resp: 17 15  SpO2: 99% 99%    General: Awake, no distress.  CV:  Good peripheral perfusion.  Regular rate and rhythm  Resp:  Normal effort.  Equal breath sounds bilaterally.  Abd:  No distention.  Soft, nontender.    ED Results / Procedures / Treatments   EKG  EKG viewed and interpreted by myself shows a normal  sinus rhythm at 79 bpm with a narrow QRS, normal axis, normal intervals, no concerning ST changes.  RADIOLOGY  I have reviewed interpreted CT head images.  No bleed seen on my evaluation. Radiology has read the CT scan as negative. CTA shows no significant finding in the brain.  MEDICATIONS ORDERED IN ED: Medications  sodium chloride  flush (NS) 0.9 % injection 3 mL (3 mLs Intravenous Given 04/10/24 1706)  iohexol  (OMNIPAQUE ) 350 MG/ML injection 100 mL (100 mLs Intravenous Contrast Given 04/10/24 1651)     IMPRESSION / MDM / ASSESSMENT AND PLAN / ED COURSE  I reviewed the triage vital signs and the nursing notes.  Patient's presentation is most consistent with acute presentation with potential threat to life or bodily function.  Patient presents to the emergency department for chest pain left arm and face weakness/tingling starting 2 hours prior to arrival.  Patient's workup in the emergency department is overall reassuring.  Reassuring CBC reassuring chemistry negative troponin.  Patient  CT scan of the head came back negative for acute intracranial abnormality.  CTA of the head and neck shows no significant finding in the head or neck vessels.  I spoke to the neurologist regarding the patient.  Given the patient's concerning onset of symptoms and neurology would like the patient admitted to the hospital for further workup treatment MRI and stroke workup.  I discussed this with the patient they are agreeable to this plan of care as well.  FINAL CLINICAL IMPRESSION(S) / ED DIAGNOSES   TIA Chest pain   Note:  This document was prepared using Dragon voice recognition software and may include unintentional dictation errors.   Dorothyann Drivers, MD 04/10/24 [phone]

## 2024-04-11 ENCOUNTER — Observation Stay (HOSPITAL_BASED_OUTPATIENT_CLINIC_OR_DEPARTMENT_OTHER)
Admit: 2024-04-11 | Discharge: 2024-04-11 | Disposition: A | Discharge status: Home / Self Care | Attending: Student in an Organized Health Care Education/Training Program

## 2024-04-11 DIAGNOSIS — G459 Transient cerebral ischemic attack, unspecified: Principal | ICD-10-CM

## 2024-04-11 DIAGNOSIS — R079 Chest pain, unspecified: Secondary | ICD-10-CM | POA: Diagnosis not present

## 2024-04-11 LAB — ECHOCARDIOGRAM COMPLETE
AR max vel: 2.89 cm2
AV Area VTI: 2.81 cm2
AV Area mean vel: 2.8 cm2
AV Mean grad: 4 mmHg
AV Peak grad: 7.1 mmHg
Ao pk vel: 1.34 m/s
Area-P 1/2: 3.11 cm2
Height: 64 in
MV VTI: 2.54 cm2
S' Lateral: 2.7 cm
Weight: 3065.28 [oz_av]

## 2024-04-11 LAB — LIPID PANEL
Cholesterol: 161 mg/dL (ref 0–200)
HDL: 37 mg/dL — ABNORMAL LOW (ref >40)
LDL Cholesterol: 87 mg/dL (ref 0–99)
Total CHOL/HDL Ratio: 4.4 ratio
Triglycerides: 187 mg/dL — ABNORMAL HIGH (ref <150)
VLDL: 37 mg/dL (ref 0–40)

## 2024-04-11 LAB — HIV ANTIBODY (ROUTINE TESTING W REFLEX): HIV Screen 4th Generation wRfx: NONREACTIVE

## 2024-04-11 MED ORDER — CLOPIDOGREL BISULFATE 75 MG PO TABS: 75.0000 mg | ORAL_TABLET | Freq: Every day | ORAL | 0 refills | Status: AC

## 2024-04-11 MED ORDER — ATORVASTATIN CALCIUM 40 MG PO TABS: 40.0000 mg | ORAL_TABLET | Freq: Every day | ORAL | 0 refills | Status: DC

## 2024-04-11 MED ORDER — ASPIRIN 81 MG PO TBEC: 81.0000 mg | DELAYED_RELEASE_TABLET | Freq: Every day | ORAL | 0 refills | Status: AC

## 2024-04-11 NOTE — Plan of Care (Signed)
  Problem: Education: Goal: Knowledge of disease or condition will improve Outcome: Progressing Goal: Knowledge of patient specific risk factors will improve (DELETE if not current risk factor) Outcome: Progressing   Problem: Ischemic Stroke/TIA Tissue Perfusion: Goal: Complications of ischemic stroke/TIA will be minimized Outcome: Progressing   Problem: Nutrition: Goal: Risk of aspiration will decrease 04/11/2024 0543 by Gwenn Melnick, RN Outcome: Progressing 04/11/2024 0541 by Gwenn Melnick, RN Outcome: Progressing   Problem: Education: Goal: Knowledge of General Education information will improve Description: Including pain rating scale, medication(s)/side effects and non-pharmacologic comfort measures 04/11/2024 0543 by Gwenn Melnick, RN Outcome: Progressing 04/11/2024 0541 by Gwenn Melnick, RN Outcome: Progressing   Problem: Clinical Measurements: Goal: Cardiovascular complication will be avoided Outcome: Progressing   Problem: Coping: Goal: Level of anxiety will decrease 04/11/2024 0543 by Gwenn Melnick, RN Outcome: Progressing 04/11/2024 0541 by Gwenn Melnick, RN Outcome: Progressing   Problem: Elimination: Goal: Will not experience complications related to bowel motility Outcome: Progressing   Problem: Pain Managment: Goal: General experience of comfort will improve and/or be controlled Outcome: Progressing   Problem: Safety: Goal: Ability to remain free from injury will improve 04/11/2024 0543 by Gwenn Melnick, RN Outcome: Progressing 04/11/2024 0541 by Gwenn Melnick, RN Outcome: Progressing

## 2024-04-11 NOTE — Progress Notes (Signed)
 OT Cancellation Note  Patient Details Name: Paul Newton MRN: [id number] DOB: November 01, 1980   Cancelled Treatment:    Reason Eval/Treat Not Completed: OT screened, no needs identified, will sign off. Pt and wife present, pt performed bed mobility, LB dressing and ambulated within room independently without AD use. He reports no further numbness to his face or LUE and no further chest pain. Reports he is back to his baseline with no further therapy needs. OT to sign off in house.  Margurete Guaman E Hayes Rehfeldt 04/11/2024, 8:00 AM

## 2024-04-11 NOTE — Progress Notes (Incomplete)
 PROGRESS NOTE Paul Newton    DOB: Dec 12, 1980, 43 y.o.  FMW:[id number]    Code Status: Full Code   DOA: 04/10/2024   LOS: 0  Brief hospital course  Paul Newton is a 44 y.o. male with a PMH significant for ***  They presented from *** to the ED on 04/10/2024 with *** x *** days. ***  In the ED, it was found that they had ***.  Significant findings included ***.  They were initially treated with ***.   Patient was admitted to medicine service for further workup and management of *** as outlined in detail below.  04/11/24 -***  Assessment & Plan  Principal Problem:   Stroke (HCC)  *** -   *** -   *** -   *** -   *** -   Body mass index is 32.88 kg/m.  VTE ppx:    Diet:     Diet   Diet Heart Room service appropriate? Yes; Fluid consistency: Thin   Consultants: ***  Subjective 04/11/24    Pt reports ***   Objective  Blood pressure 126/88, pulse 77, temperature 98.1 F (36.7 C), temperature source Oral, resp. rate 16, height 5' 4 (1.626 m), weight 86.9 kg, SpO2 99%. No intake or output data in the 24 hours ending 04/11/24 0730 Filed Weights   04/10/24 1620 04/10/24 2257  Weight: 89.6 kg 86.9 kg     Physical Exam: *** General: awake, alert, NAD HEENT: atraumatic, clear conjunctiva, anicteric sclera, MMM, hearing grossly normal Respiratory: normal respiratory effort. Cardiovascular: extremities well perfused, quick capillary refill, normal S1/S2, RRR, no JVD, murmurs Gastrointestinal: soft, NT, ND Nervous: A&O x3. no gross focal neurologic deficits, normal speech Extremities: moves all equally, no edema, normal tone Skin: dry, intact, normal temperature, normal color. No rashes, lesions or ulcers on exposed skin Psychiatry: normal mood, congruent affect  Labs   I have personally reviewed the following labs and imaging studies CBC    Component Value Date/Time   WBC 11.1 (H) 04/10/2024 1624   RBC 5.03 04/10/2024 1624   HGB 16.0  04/10/2024 1624   HGB 16.8 07/22/2023 0939   HCT 47.5 04/10/2024 1624   HCT 49.2 07/22/2023 0939   PLT 264 04/10/2024 1624   PLT 286 07/22/2023 0939   MCV 94.4 04/10/2024 1624   MCV 96 07/22/2023 0939   MCV 94 09/19/2012 2059   MCH 31.8 04/10/2024 1624   MCHC 33.7 04/10/2024 1624   RDW 13.1 04/10/2024 1624   RDW 13.1 07/22/2023 0939   RDW 14.1 09/19/2012 2059   LYMPHSABS 2.9 04/10/2024 1624   LYMPHSABS 3.3 (H) 07/22/2023 0939   MONOABS 0.8 04/10/2024 1624   EOSABS 0.1 04/10/2024 1624   EOSABS 0.2 07/22/2023 0939   BASOSABS 0.1 04/10/2024 1624   BASOSABS 0.1 07/22/2023 0939      Latest Ref Rng & Units 04/10/2024    4:24 PM 07/22/2023    9:39 AM 09/10/2022   12:55 PM  BMP  Glucose 70 - 99 mg/dL 88  85  95   BUN 6 - 20 mg/dL 14  12  16    Creatinine 0.61 - 1.24 mg/dL 9.38 - 8.75 mg/dL 8.90    8.93  8.85  8.91   BUN/Creat Ratio 9 - 20  11    Sodium 135 - 145 mmol/L 139  140  138   Potassium 3.5 - 5.1 mmol/L 3.7  3.9  4.1   Chloride 98 - 111 mmol/L 107  102  102  CO2 22 - 32 mmol/L 21  23  26    Calcium  8.9 - 10.3 mg/dL 9.1  9.3  9.3     MR BRAIN WO CONTRAST Result Date: 04/10/2024 CLINICAL DATA:  Neuro deficit, acute, stroke suspected1 EXAM: MRI HEAD WITHOUT CONTRAST TECHNIQUE: Multiplanar, multiecho pulse sequences of the brain and surrounding structures were obtained without intravenous contrast. COMPARISON:  CT head and CTA head/neck earlier today. FINDINGS: Brain: No acute infarction, hemorrhage, hydrocephalus, extra-axial collection or mass lesion. Vascular: Major arterial flow voids are maintained at the skull base. Skull and upper cervical spine: Normal marrow signal. Sinuses/Orbits: Clear sinuses.  No acute orbital findings. Other: No mastoid effusions. IMPRESSION: No evidence of acute intracranial abnormality. Electronically Signed   By: Gilmore GORMAN Molt M.D.   On: 04/10/2024 22:07   DG Chest Port 1 View Result Date: 04/10/2024 CLINICAL DATA:  355200 Chest pain 355200  EXAM: PORTABLE CHEST - 1 VIEW COMPARISON:  August 19, 2023 FINDINGS: No focal airspace consolidation, pleural effusion, or pneumothorax. No cardiomegaly.No acute fracture or destructive lesion. IMPRESSION: No acute cardiopulmonary abnormality. Electronically Signed   By: Rogelia Myers M.D.   On: 04/10/2024 17:38   CT ANGIO CHEST AORTA W/CM &/OR WO/CM Result Date: 04/10/2024 CLINICAL DATA:  Dissecting aneurysm of the aorta. EXAM: CT ANGIOGRAPHY CHEST WITH CONTRAST TECHNIQUE: Multidetector CT imaging of the chest was performed using the standard protocol during bolus administration of intravenous contrast. Multiplanar CT image reconstructions and MIPs were obtained to evaluate the vascular anatomy. RADIATION DOSE REDUCTION: This exam was performed according to the departmental dose-optimization program which includes automated exposure control, adjustment of the mA and/or kV according to patient size and/or use of iterative reconstruction technique. CONTRAST:  OMNIPAQUE  IOHEXOL  350 MG/ML SOLN COMPARISON:  None Available. FINDINGS: Cardiovascular: Preferential opacification of the thoracic aorta. No evidence of thoracic aortic aneurysm or dissection. Normal heart size. No pericardial effusion. Mediastinum/Nodes: No enlarged mediastinal, hilar, or axillary lymph nodes. Thyroid  gland, trachea, and esophagus demonstrate no significant findings. Lungs/Pleura: Mild emphysema present. The lungs are otherwise clear. There is no pleural effusion or pneumothorax. There is a 2 mm nodule in the right lower lobe image 7/94. Upper Abdomen: No acute abnormality. Musculoskeletal: No chest wall abnormality. No acute or significant osseous findings. Review of the MIP images confirms the above findings. IMPRESSION: 1. No evidence for aortic dissection or aneurysm. 2. Mild emphysema. 3. Right solid pulmonary nodule measuring 2 mm. No follow-up needed if patient is low-risk.This recommendation follows the consensus statement:  Guidelines for Management of Incidental Pulmonary Nodules Detected on CT Images: From the Fleischner Society 2017; Radiology 2017; 284:228-243. Emphysema (ICD10-J43.9). Electronically Signed   By: Greig Pique M.D.   On: 04/10/2024 17:14   CT ANGIO HEAD NECK W WO CM (CODE STROKE) Result Date: 04/10/2024 CLINICAL DATA:  Provided history: Neuro deficit, acute, stroke suspected. EXAM: CT ANGIOGRAPHY HEAD AND NECK WITH AND WITHOUT CONTRAST TECHNIQUE: Multidetector CT imaging of the head and neck was performed using the standard protocol during bolus administration of intravenous contrast. Multiplanar CT image reconstructions and MIPs were obtained to evaluate the vascular anatomy. Carotid stenosis measurements (when applicable) are obtained utilizing NASCET criteria, using the distal internal carotid diameter as the denominator. RADIATION DOSE REDUCTION: This exam was performed according to the departmental dose-optimization program which includes automated exposure control, adjustment of the mA and/or kV according to patient size and/or use of iterative reconstruction technique. CONTRAST:  OMNIPAQUE  IOHEXOL  350 MG/ML SOLN COMPARISON:  Non-contrast head CT performed  earlier today 04/10/2024. FINDINGS: CTA NECK FINDINGS Aortic arch: Standard aortic branching. Atherosclerotic plaque within visible portions of the aortic arch. No hemodynamically significant innominate or proximal subclavian artery stenosis. Right carotid system: CCA and ICA patent within the neck without stenosis or significant atherosclerotic disease. Left carotid system: CCA and ICA patent within the neck without stenosis or significant atherosclerotic disease. Vertebral arteries: Codominant and patent within the neck without stenosis or significant atherosclerotic disease. Skeleton: Cervical spondylosis. No acute fracture or aggressive osseous lesion. Other neck: No neck mass or cervical lymphadenopathy. Upper chest: No consolidation within  the imaged lung apices. Biapical paraseptal emphysema. Review of the MIP images confirms the above findings CTA HEAD FINDINGS Anterior circulation: The intracranial internal carotid arteries are patent. The M1 middle cerebral arteries are patent. No M2 proximal branch occlusion is identified. Severe stenosis within a proximal-to-mid M2 right MCA branch (series 11, image 12). The anterior cerebral arteries are patent. No intracranial aneurysm is identified. Posterior circulation: The intracranial vertebral arteries are patent. The basilar artery is patent. The posterior cerebral arteries are patent. Posterior communicating arteries are diminutive or absent, bilaterally. Moderate-to-severe stenosis within the left posterior cerebral artery P2 segment (series 11, image 22). Venous sinuses: Within the limitations of contrast timing, no convincing thrombus. Anatomic variants: As described. Review of the MIP images confirms the above findings No emergent large vessel occlusion identified. These results were communicated to Dr. Rosemarie at 5:08 pmon 9/22/2025by text page via the [facility] messaging system. IMPRESSION: CTA neck: 1. The common carotid, internal carotid and vertebral arteries are patent within the neck without stenosis or significant atherosclerotic disease. 2. Aortic Atherosclerosis (ICD10-I70.0) and Emphysema (ICD10-J43.9). CTA head: 1. No proximal intracranial large vessel occlusion identified. 2. 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 3. Moderate-to-severe stenosis within the left posterior cerebral artery P2 segment. Electronically Signed   By: Rockey Childs D.O.   On: 04/10/2024 17:09   CT HEAD CODE STROKE WO CONTRAST Result Date: 04/10/2024 CLINICAL DATA:  Code stroke. Neuro deficit, acute, stroke suspected. EXAM: CT HEAD WITHOUT CONTRAST TECHNIQUE: Contiguous axial images were obtained from the base of the skull through the vertex without intravenous contrast.  RADIATION DOSE REDUCTION: This exam was performed according to the departmental dose-optimization program which includes automated exposure control, adjustment of the mA and/or kV according to patient size and/or use of iterative reconstruction technique. COMPARISON:  Head CT 04/30/2021. FINDINGS: Brain: No age-advanced or lobar predominant cerebral atrophy. There is no acute intracranial hemorrhage. No demarcated cortical infarct. No extra-axial fluid collection. No evidence of an intracranial mass. No midline shift. Vascular: No hyperdense vessel. Skull: No calvarial fracture or aggressive osseous lesion. Sinuses/Orbits: No mass or acute finding within the imaged orbits. No significant paranasal sinus disease at the imaged levels. ASPECTS [facility] Stroke Program Early CT Score) - Ganglionic level infarction (caudate, lentiform nuclei, internal capsule, insula, M1-M3 cortex): 7 - Supraganglionic infarction (M4-M6 cortex): 3 Total score (0-10 with 10 being normal): 10 No evidence of an acute intracranial abnormality. These results were communicated to Dr. Rosemarie at 4:50 pmon 9/22/2025by text page via the [facility] messaging system. IMPRESSION: No evidence of an acute intracranial abnormality. Electronically Signed   By: Rockey Childs D.O.   On: 04/10/2024 16:50    Disposition Plan & Communication  Patient status: Observation  Admitted From: {From:[id number]} Planned disposition location: {PLAN; DISPOSITION:26386} Anticipated discharge date: *** pending ***  Family Communication: ***    Author: Marien LITTIE Piety, DO [facility]  04/11/2024, 7:30 AM   Available by Epic secure chat 7AM-7PM. If 7PM-7AM, please contact night-coverage.  [facility] contact information found on [email or web address].

## 2024-04-11 NOTE — Progress Notes (Signed)
*  PRELIMINARY RESULTS* Echocardiogram 2D Echocardiogram has been performed.  Paul Newton 04/11/2024, 11:46 AM

## 2024-04-11 NOTE — Discharge Instructions (Signed)
 Please take your new cholesterol medication as well as aspirin  daily. You will also take plavix  daily for 21 days and then can stop.   Follow up with your PCP in 1-2 weeks to discuss further prevention such as blood pressure control.   I recommend you monitor your blood pressure daily at home to have more data to decide whether or not to start a medication

## 2024-04-11 NOTE — Plan of Care (Signed)
  Problem: Education: Goal: Knowledge of patient specific risk factors will improve (DELETE if not current risk factor) Outcome: Progressing   Problem: Ischemic Stroke/TIA Tissue Perfusion: Goal: Complications of ischemic stroke/TIA will be minimized Outcome: Progressing   Problem: Education: Goal: Knowledge of General Education information will improve Description: Including pain rating scale, medication(s)/side effects and non-pharmacologic comfort measures Outcome: Progressing   Problem: Clinical Measurements: Goal: Cardiovascular complication will be avoided Outcome: Progressing   Problem: Coping: Goal: Level of anxiety will decrease Outcome: Progressing   Problem: Elimination: Goal: Will not experience complications related to bowel motility Outcome: Progressing   Problem: Safety: Goal: Ability to remain free from injury will improve Outcome: Progressing

## 2024-04-11 NOTE — Progress Notes (Signed)
 PT Cancellation Note  Patient Details Name: JOSEAN LYCAN MRN: [id number] DOB: [birth year]   Cancelled Treatment:    Reason Eval/Treat Not Completed: PT screened, no needs identified, will sign off Pt screened, ambulating independently with no AD, per OT pt stated he feels he is back to his baseline. No PT needs identified at this time, PT to sign-off.   Marvella Jenning, SPT

## 2024-04-11 NOTE — Discharge Summary (Addendum)
 Physician Discharge Summary  Patient: Paul Newton:[id number] DOB: [birth year]   Code Status: Full Code Admit date: 04/10/2024 Discharge date: 04/11/2024 Disposition: Home, No home health services recommended PCP: Paul Toribio [name], MD  Recommendations for Outpatient Follow-up:  Follow up with PCP within 1-2 weeks Regarding general hospital follow up and preventative care Recommend monitoring BP and start antihypertensive as appropriate   Discharge Diagnoses:  Principal Problem:   Stroke [facility]) Active Problems:   TIA (transient ischemic attack)   Chest pain  Brief Hospital Course Summary: Paul Newton is a 43 y.o. male with a PMH significant for HTN, tobacco use, anxiety. At baseline, they live at home with their wife and are independent with their ADLs.   They presented from home to the ED on 04/10/2024 with acute onset chest pain, left arm weakness and left facial numbness. He was building a shed when he began having chest pain. Does not endorse SOB, diaphoresis. Within minutes, he began having left arm weakness and facial numbness. He and his wife deny noticing any abnormal speech or facial asymmetry. He denies any memory changes and no swallowing issues. Denies balance abnormalities or lower extremity weakness.    In the ED, it was found that they had moderate hypertension and a headache but otherwise stable vital signs.  Significant findings included: unremarkable CMP. Negative troponins x2. Negative ethanol. Mild leukocytosis but otherwise normal CBC.  Chest xray: negative acute Head CT, CTA head and neck: negative acute.  Neurology was consulted and recommended brain MRI for suspected stroke.  ECG- no acute ST, t-wave changes   They were initially treated with nothing. Allowed to have permissive HTN period.    Patient was admitted to medicine service for further workup and management of weakness as outlined in detail below.  Follow up brain MRI was obtained per  neurology recommendations to rule out suspected thalamic stroke. This imaging was also negative for acute findings.  Patient's symptoms resolved spontaneously shortly after being admitted.  Echo bubble study was unremarkable.  He likely had TIA vs other contributions but acute CVA and ACS were ruled out.  Throughout encounters, patient displayed signs of anxiety which may have contributed to presenting symptoms and should be addressed.  Neurology recommended 21 days DAPT followed by aspirin  alone and a cholesterol medication and not necessary to have neurology follow up at this time.  His BP improved throughout admission and SBP was 130-140s. At this time, patient does not want to start on blood pressure management and states that he will follow up with his PCP to discuss. He had not picked up prior prescription for valsartan .  No PT/OT/SLP evaluation since symptoms resolved and patient is at baseline and independent.  Provided patient with extensive smoking cessation counseling.   All other chronic conditions were treated with home medications.    Discharge Condition: Good, improved Recommended discharge diet: Regular healthy diet  Consultations: Neurology   Procedures/Studies: Brain MRI  Allergies as of 04/11/2024       Reactions   Hyoscyamine Other (See Comments)   Felt like he couldn't swallow- made his throat unusually dry         Medication List     STOP taking these medications    valsartan  40 MG tablet Commonly known as: DIOVAN        TAKE these medications    aspirin  EC 81 MG tablet Take 1 tablet (81 mg total) by mouth daily. Swallow whole.   atorvastatin  40 MG tablet Commonly  known as: Lipitor Take 1 tablet (40 mg total) by mouth daily.   clopidogrel  75 MG tablet Commonly known as: Plavix  Take 1 tablet (75 mg total) by mouth daily for 21 days.        Follow-up Information     Paul Toribio POUR, MD Follow up.   Specialty: Family Medicine Why:  hospital follow up Contact information: [address] [place] [address] [phone]                 Subjective   Pt reports feeling well. Denies any symptoms from baseline.   All questions and concerns were addressed at time of discharge.  Objective  Blood pressure 125/86, pulse (!) 58, temperature 98 F (36.7 C), temperature source Oral, resp. rate 16, height 5' 4 (1.626 m), weight 86.9 kg, SpO2 98%.   General: Pt is alert, awake, not in acute distress Cardiovascular: RRR, S1/S2 +, no rubs, no gallops Respiratory: CTA bilaterally, no wheezing, no rhonchi Abdominal: Soft, NT, ND, bowel sounds + Extremities: no edema, no cyanosis  The results of significant diagnostics from this hospitalization (including imaging, microbiology, ancillary and laboratory) are listed below for reference.   Imaging studies: MR BRAIN WO CONTRAST Result Date: 04/10/2024 CLINICAL DATA:  Neuro deficit, acute, stroke suspected1 EXAM: MRI HEAD WITHOUT CONTRAST TECHNIQUE: Multiplanar, multiecho pulse sequences of the brain and surrounding structures were obtained without intravenous contrast. COMPARISON:  CT head and CTA head/neck earlier today. FINDINGS: Brain: No acute infarction, hemorrhage, hydrocephalus, extra-axial collection or mass lesion. Vascular: Major arterial flow voids are maintained at the skull base. Skull and upper cervical spine: Normal marrow signal. Sinuses/Orbits: Clear sinuses.  No acute orbital findings. Other: No mastoid effusions. IMPRESSION: No evidence of acute intracranial abnormality. Electronically Signed   By: Gilmore GORMAN Molt M.D.   On: 04/10/2024 22:07   DG Chest Port 1 View Result Date: 04/10/2024 CLINICAL DATA:  355200 Chest pain 355200 EXAM: PORTABLE CHEST - 1 VIEW COMPARISON:  August 19, 2023 FINDINGS: No focal airspace consolidation, pleural effusion, or pneumothorax. No cardiomegaly.No acute fracture or destructive lesion. IMPRESSION: No acute cardiopulmonary  abnormality. Electronically Signed   By: Rogelia Myers M.D.   On: 04/10/2024 17:38   CT ANGIO CHEST AORTA W/CM &/OR WO/CM Result Date: 04/10/2024 CLINICAL DATA:  Dissecting aneurysm of the aorta. EXAM: CT ANGIOGRAPHY CHEST WITH CONTRAST TECHNIQUE: Multidetector CT imaging of the chest was performed using the standard protocol during bolus administration of intravenous contrast. Multiplanar CT image reconstructions and MIPs were obtained to evaluate the vascular anatomy. RADIATION DOSE REDUCTION: This exam was performed according to the departmental dose-optimization program which includes automated exposure control, adjustment of the mA and/or kV according to patient size and/or use of iterative reconstruction technique. CONTRAST:  OMNIPAQUE  IOHEXOL  350 MG/ML SOLN COMPARISON:  None Available. FINDINGS: Cardiovascular: Preferential opacification of the thoracic aorta. No evidence of thoracic aortic aneurysm or dissection. Normal heart size. No pericardial effusion. Mediastinum/Nodes: No enlarged mediastinal, hilar, or axillary lymph nodes. Thyroid  gland, trachea, and esophagus demonstrate no significant findings. Lungs/Pleura: Mild emphysema present. The lungs are otherwise clear. There is no pleural effusion or pneumothorax. There is a 2 mm nodule in the right lower lobe image 7/94. Upper Abdomen: No acute abnormality. Musculoskeletal: No chest wall abnormality. No acute or significant osseous findings. Review of the MIP images confirms the above findings. IMPRESSION: 1. No evidence for aortic dissection or aneurysm. 2. Mild emphysema. 3. Right solid pulmonary nodule measuring 2 mm. No follow-up needed if  patient is low-risk.This recommendation follows the consensus statement: Guidelines for Management of Incidental Pulmonary Nodules Detected on CT Images: From the Fleischner Society 2017; Radiology 2017; 284:228-243. Emphysema (ICD10-J43.9). Electronically Signed   By: Greig Pique M.D.   On: 04/10/2024  17:14   CT ANGIO HEAD NECK W WO CM (CODE STROKE) Result Date: 04/10/2024 CLINICAL DATA:  Provided history: Neuro deficit, acute, stroke suspected. EXAM: CT ANGIOGRAPHY HEAD AND NECK WITH AND WITHOUT CONTRAST TECHNIQUE: Multidetector CT imaging of the head and neck was performed using the standard protocol during bolus administration of intravenous contrast. Multiplanar CT image reconstructions and MIPs were obtained to evaluate the vascular anatomy. Carotid stenosis measurements (when applicable) are obtained utilizing NASCET criteria, using the distal internal carotid diameter as the denominator. RADIATION DOSE REDUCTION: This exam was performed according to the departmental dose-optimization program which includes automated exposure control, adjustment of the mA and/or kV according to patient size and/or use of iterative reconstruction technique. CONTRAST:  OMNIPAQUE  IOHEXOL  350 MG/ML SOLN COMPARISON:  Non-contrast head CT performed earlier today 04/10/2024. FINDINGS: CTA NECK FINDINGS Aortic arch: Standard aortic branching. Atherosclerotic plaque within visible portions of the aortic arch. No hemodynamically significant innominate or proximal subclavian artery stenosis. Right carotid system: CCA and ICA patent within the neck without stenosis or significant atherosclerotic disease. Left carotid system: CCA and ICA patent within the neck without stenosis or significant atherosclerotic disease. Vertebral arteries: Codominant and patent within the neck without stenosis or significant atherosclerotic disease. Skeleton: Cervical spondylosis. No acute fracture or aggressive osseous lesion. Other neck: No neck mass or cervical lymphadenopathy. Upper chest: No consolidation within the imaged lung apices. Biapical paraseptal emphysema. Review of the MIP images confirms the above findings CTA HEAD FINDINGS Anterior circulation: The intracranial internal carotid arteries are patent. The M1 middle cerebral arteries  are patent. No M2 proximal branch occlusion is identified. Severe stenosis within a proximal-to-mid M2 right MCA branch (series 11, image 12). The anterior cerebral arteries are patent. No intracranial aneurysm is identified. Posterior circulation: The intracranial vertebral arteries are patent. The basilar artery is patent. The posterior cerebral arteries are patent. Posterior communicating arteries are diminutive or absent, bilaterally. Moderate-to-severe stenosis within the left posterior cerebral artery P2 segment (series 11, image 22). Venous sinuses: Within the limitations of contrast timing, no convincing thrombus. Anatomic variants: As described. Review of the MIP images confirms the above findings No emergent large vessel occlusion identified. These results were communicated to Dr. Rosemarie at 5:08 pmon 9/22/2025by text page via the [facility] messaging system. IMPRESSION: CTA neck: 1. The common carotid, internal carotid and vertebral arteries are patent within the neck without stenosis or significant atherosclerotic disease. 2. Aortic Atherosclerosis (ICD10-I70.0) and Emphysema (ICD10-J43.9). CTA head: 1. No proximal intracranial large vessel occlusion identified. 2. 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 3. Moderate-to-severe stenosis within the left posterior cerebral artery P2 segment. Electronically Signed   By: Rockey Childs D.O.   On: 04/10/2024 17:09   CT HEAD CODE STROKE WO CONTRAST Result Date: 04/10/2024 CLINICAL DATA:  Code stroke. Neuro deficit, acute, stroke suspected. EXAM: CT HEAD WITHOUT CONTRAST TECHNIQUE: Contiguous axial images were obtained from the base of the skull through the vertex without intravenous contrast. RADIATION DOSE REDUCTION: This exam was performed according to the departmental dose-optimization program which includes automated exposure control, adjustment of the mA and/or kV according to patient size and/or use of iterative  reconstruction technique. COMPARISON:  Head CT 04/30/2021. FINDINGS: Brain: No age-advanced or  lobar predominant cerebral atrophy. There is no acute intracranial hemorrhage. No demarcated cortical infarct. No extra-axial fluid collection. No evidence of an intracranial mass. No midline shift. Vascular: No hyperdense vessel. Skull: No calvarial fracture or aggressive osseous lesion. Sinuses/Orbits: No mass or acute finding within the imaged orbits. No significant paranasal sinus disease at the imaged levels. ASPECTS [facility] Stroke Program Early CT Score) - Ganglionic level infarction (caudate, lentiform nuclei, internal capsule, insula, M1-M3 cortex): 7 - Supraganglionic infarction (M4-M6 cortex): 3 Total score (0-10 with 10 being normal): 10 No evidence of an acute intracranial abnormality. These results were communicated to Dr. Rosemarie at 4:50 pmon 9/22/2025by text page via the [facility] messaging system. IMPRESSION: No evidence of an acute intracranial abnormality. Electronically Signed   By: Rockey Childs D.O.   On: 04/10/2024 16:50    Labs: Basic Metabolic Panel: Recent Labs  Lab 04/10/24 1624  NA 139  K 3.7  CL 107  CO2 21*  GLUCOSE 88  BUN 14  CREATININE 1.06  1.09  CALCIUM  9.1   CBC: Recent Labs  Lab 04/10/24 1624  WBC 11.1*  NEUTROABS 7.3  HGB 16.0  HCT 47.5  MCV 94.4  PLT 264   Microbiology: Results for orders placed or performed during the hospital encounter of 10/08/22  Culture, group A strep     Status: None   Collection Time: 10/08/22  1:49 PM   Specimen: Throat  Result Value Ref Range Status   Specimen Description THROAT  Final   Special Requests NONE  Final   Culture   Final    FEW STREPTOCOCCUS,BETA HEMOLYTIC NOT GROUP A Beta hemolytic streptococci are predictably susceptible to penicillin and other beta lactams. Susceptibility testing not routinely performed. Performed at [facility], [address] [place], [place] [address]    Report Status 10/10/2022  FINAL  Final    Time coordinating discharge: Over 30 minutes  Marien LITTIE Piety, MD  [facility] 04/11/2024, 10:57 AM

## 2024-04-12 ENCOUNTER — Telehealth: Payer: Self-pay

## 2024-04-12 NOTE — Transitions of Care (Post Inpatient/ED Visit) (Unsigned)
   04/12/2024  Name: Paul Newton MRN: [id number] DOB: [birth year]  Today's TOC FU Call Status: Today's TOC FU Call Status:: Unsuccessful Call (1st Attempt) Unsuccessful Call (1st Attempt) Date: 04/12/24  Attempted to reach the patient regarding the most recent Inpatient/ED visit.  Follow Up Plan: Additional outreach attempts will be made to reach the patient to complete the Transitions of Care (Post Inpatient/ED visit) call.   Signature Julian Lemmings, LPN [facility] Nurse Health Advisor Direct Dial [phone]

## 2024-04-13 ENCOUNTER — Encounter: Payer: Self-pay | Admitting: Family Medicine

## 2024-04-13 ENCOUNTER — Ambulatory Visit (INDEPENDENT_AMBULATORY_CARE_PROVIDER_SITE_OTHER): Admitting: Family Medicine

## 2024-04-13 VITALS — BP 150/100 | HR 65 | Ht 64.0 in | Wt 194.1 lb

## 2024-04-13 DIAGNOSIS — R911 Solitary pulmonary nodule: Secondary | ICD-10-CM | POA: Diagnosis not present

## 2024-04-13 DIAGNOSIS — G459 Transient cerebral ischemic attack, unspecified: Secondary | ICD-10-CM

## 2024-04-13 DIAGNOSIS — J439 Emphysema, unspecified: Secondary | ICD-10-CM | POA: Insufficient documentation

## 2024-04-13 DIAGNOSIS — R0782 Intercostal pain: Secondary | ICD-10-CM | POA: Diagnosis not present

## 2024-04-13 DIAGNOSIS — J432 Centrilobular emphysema: Secondary | ICD-10-CM | POA: Diagnosis not present

## 2024-04-13 DIAGNOSIS — F439 Reaction to severe stress, unspecified: Secondary | ICD-10-CM

## 2024-04-13 DIAGNOSIS — I10 Essential (primary) hypertension: Secondary | ICD-10-CM

## 2024-04-13 NOTE — Assessment & Plan Note (Signed)
 Long-term heavy smoker with recent cessation. CT chest from hospital admission showed emphysema and a lung nodule in the right lung. - Strongly advised to continue smoking cessation. - Offered pharmacotherapy (e.g., Chantix, nicotine replacement) if struggling with cravings or relapse.

## 2024-04-13 NOTE — Patient Instructions (Signed)
 It was nice to see you today,  We addressed the following topics today: -I would like you to check your blood pressure twice a day once in the morning and once in the evening for the next 2 weeks and then return those values to us  either through MyChart or in person. - I would recommend you continue taking your cholesterol medication. - Take the aspirin  and Plavix  for 21 days, then afterwards continue taking the aspirin  indefinitely.   Have a great day,  Rolan Slain, MD

## 2024-04-13 NOTE — Assessment & Plan Note (Signed)
 History of hypertension. BP elevated in clinic today. Home BP log shows systolic readings frequently in the 130s. Goal BP is an average of <130/80. - Will monitor home BPs for now rather than starting medication. - Instructed to check BP twice daily (morning and evening) for two weeks. - To return BP log by sending a picture via MyChart or bringing the sheet to the office. - Will reassess need for antihypertensive medication after reviewing the log.

## 2024-04-13 NOTE — Assessment & Plan Note (Signed)
 Reports recent jitteriness and feeling on edge related to work stress. - Declined pharmacotherapy or referral to therapy at this time. - Advised that options are available if they change their mind. Can schedule an appointment to discuss further if needed.

## 2024-04-13 NOTE — Transitions of Care (Post Inpatient/ED Visit) (Signed)
   04/13/2024  Name: Paul Newton MRN: [id number] DOB: [birth year]  Today's TOC FU Call Status: Today's TOC FU Call Status:: Unsuccessful Call (1st Attempt) Unsuccessful Call (1st Attempt) Date: 04/12/24  Attempted to reach the patient regarding the most recent Inpatient/ED visit.  Follow Up Plan: No further outreach attempts will be made at this time. We have been unable to contact the patient. Patient already seen in office Signature Julian Lemmings, LPN [facility] Nurse Health Advisor Direct Dial [phone]

## 2024-04-13 NOTE — Assessment & Plan Note (Signed)
 Reports intermittent left-sided chest pain, which is reproducible on palpation and not associated with exertion. Examination is consistent with a musculoskeletal origin. - Reassured that the nature of the pain is not consistent with a cardiac origin. - Continue to monitor.

## 2024-04-13 NOTE — Assessment & Plan Note (Signed)
 Recent hospitalization for transient neurological symptoms (left arm tingling, left facial numbness) which have resolved. CT head showed evidence of plaque in the right middle cerebral artery. Hospital workup was negative for acute MI (troponins normal). LDL was 87, but given TIA, goal is <70, ideally <55. - Continue aspirin  daily indefinitely. - Continue Plavix  for the initial 21-day course as prescribed by the hospital, then discontinue. - Start Lipitor 40mg  daily. Counseled on side effects, primarily muscle and joint pain, and to report if these occur. - Follow up in 8-10 weeks. Will check lipid panel prior to the visit to assess response to therapy.

## 2024-04-13 NOTE — Assessment & Plan Note (Signed)
-   Per CT chest findings of a lung nodule, a follow-up CT chest is recommended in 6 months to monitor for any changes. Order placed for repeat CT chest in 6 months.

## 2024-04-13 NOTE — Progress Notes (Signed)
 Established Patient Office Visit  Subjective   Patient ID: Paul Newton, male    DOB: 02-11-1981  Age: 43 y.o. MRN: [id number]  Chief Complaint  Patient presents with   Hospitalization Follow-up    HPI   Subjective - Follow-up after hospital admission for chest pain, arm tingling, and facial numbness. Symptoms presented as chest pain, followed by left arm tingling and cheek numbness. Symptoms resolved by the next morning in the hospital. Reports being back to normal with no lingering symptoms.  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 Pain is reproducible with palpation of the left chest wall. Pain is not associated with exertion but can be triggered by certain movements.  - Reports feeling jittery and on edge recently, which is attributed to work-related stress. Denies a history of anxiety or panic attacks. Declined medication or therapy for anxiety at this time.  Medications Started on Plavix  and aspirin  for 21 days post-hospitalization. Lipitor 40mg  was prescribed but not started due to uncertainty about cholesterol levels. Will start Lipitor 40mg  now.  PMH, PSH, FH, Social Hx PMHx: Transient Ischemic Attack (TIA) per hospital workup, hypertension, hyperlipidemia, emphysema. Social Hx: Smokes two packs per day for approximately 30 years. Quit 3 days ago, cold [place]. Works in [employer].  ROS Constitutional: Denies fever, chills. CV: Reports intermittent chest pain, pleuritic and reproducible on palpation. Denies exertional chest pain. Neuro: History of transient left arm tingling and left cheek numbness, now resolved. Psych: Reports recent feelings of being on edge and jittery, attributed to stress. Denies history of anxiety or panic attacks.    The ASCVD Risk score (Arnett DK, et al., 2019) failed to calculate for the following reasons:   Risk score cannot be calculated because patient has a  medical history suggesting prior/existing ASCVD  Health Maintenance Due  Topic Date Due   Pneumococcal Vaccine (1 of 2 - PCV) Never done   Hepatitis B Vaccines 19-59 Average Risk (1 of 3 - 19+ 3-dose series) Never done   HPV VACCINES (1 - 3-dose SCDM series) Never done   Influenza Vaccine  Never done   COVID-19 Vaccine (1 - 2024-25 season) Never done      Objective:     BP (!) 150/100   Pulse 65   Ht 5' 4 (1.626 m)   Wt 194 lb 1.9 oz (88.1 kg)   SpO2 99%   BMI 33.32 kg/m    Physical Exam Gen: alert, oriented HEENT: perrla, eomi, mmm CV: rrr, no murmur Pulm: lctab. No wheeze or crackles.  GI: soft, nbs.  Nontender to palpation MSK: strength equal b/l. Normal gait Ext: no pedal edema Skin: warm and dry, no rashes Psych: pleasant affect.  Spontaneous speech   No results found for any visits on 04/13/24.      Assessment & Plan:   Stress Assessment & Plan: Reports recent jitteriness and feeling on edge related to work stress. - Declined pharmacotherapy or referral to therapy at this time. - Advised that options are available if they change their mind. Can schedule an appointment to discuss further if needed.   Intercostal pain Assessment & Plan: Reports intermittent left-sided chest pain, which is reproducible on palpation and not associated with exertion. Examination is consistent with a musculoskeletal origin. - Reassured that the nature of the pain is not consistent with a cardiac origin. - Continue to monitor.   Centrilobular emphysema (HCC) Assessment & Plan: Long-term heavy smoker with recent cessation.  CT chest from hospital admission showed emphysema and a lung nodule in the right lung. - Strongly advised to continue smoking cessation. - Offered pharmacotherapy (e.g., Chantix, nicotine replacement) if struggling with cravings or relapse.    Pulmonary nodule Assessment & Plan: - Per CT chest findings of a lung nodule, a follow-up CT chest is  recommended in 6 months to monitor for any changes. Order placed for repeat CT chest in 6 months.    Primary hypertension Assessment & Plan: History of hypertension. BP elevated in clinic today. Home BP log shows systolic readings frequently in the 130s. Goal BP is an average of <130/80. - Will monitor home BPs for now rather than starting medication. - Instructed to check BP twice daily (morning and evening) for two weeks. - To return BP log by sending a picture via MyChart or bringing the sheet to the office. - Will reassess need for antihypertensive medication after reviewing the log.   TIA (transient ischemic attack) Assessment & Plan: Recent hospitalization for transient neurological symptoms (left arm tingling, left facial numbness) which have resolved. CT head showed evidence of plaque in the right middle cerebral artery. Hospital workup was negative for acute MI (troponins normal). LDL was 87, but given TIA, goal is <70, ideally <55. - Continue aspirin  daily indefinitely. - Continue Plavix  for the initial 21-day course as prescribed by the hospital, then discontinue. - Start Lipitor 40mg  daily. Counseled on side effects, primarily muscle and joint pain, and to report if these occur. - Follow up in 8-10 weeks. Will check lipid panel prior to the visit to assess response to therapy.      Return in about 10 weeks (around 06/22/2024) for HTN, hld.    Toribio [name] Slain, MD

## 2024-04-17 ENCOUNTER — Telehealth: Payer: Self-pay

## 2024-04-17 NOTE — Telephone Encounter (Signed)
 I returned her call and advised her that its suppose to be repeated in 6 months

## 2024-04-17 NOTE — Telephone Encounter (Signed)
 Copied from CRM [id number]. Topic: General - Other >> Apr 14, 2024 11:11 AM Olam RAMAN wrote: Reason for CRM: Jolan from  [facility]d ct chest and shows 9/25 and pt needs to make sure if dr needed test done if needed it done later time CB [phone] option 1 and 4

## 2024-04-20 ENCOUNTER — Ambulatory Visit: Payer: Self-pay

## 2024-04-20 ENCOUNTER — Other Ambulatory Visit: Payer: Self-pay

## 2024-04-20 ENCOUNTER — Emergency Department
Admission: EM | Admit: 2024-04-20 | Discharge: 2024-04-20 | Disposition: A | Discharge status: Home / Self Care | Attending: Emergency Medicine | Admitting: Emergency Medicine

## 2024-04-20 ENCOUNTER — Emergency Department

## 2024-04-20 ENCOUNTER — Other Ambulatory Visit

## 2024-04-20 DIAGNOSIS — G43909 Migraine, unspecified, not intractable, without status migrainosus: Secondary | ICD-10-CM | POA: Diagnosis not present

## 2024-04-20 DIAGNOSIS — G43809 Other migraine, not intractable, without status migrainosus: Secondary | ICD-10-CM | POA: Diagnosis not present

## 2024-04-20 DIAGNOSIS — I10 Essential (primary) hypertension: Secondary | ICD-10-CM | POA: Insufficient documentation

## 2024-04-20 DIAGNOSIS — R29701 NIHSS score 1: Secondary | ICD-10-CM | POA: Diagnosis not present

## 2024-04-20 DIAGNOSIS — R200 Anesthesia of skin: Secondary | ICD-10-CM | POA: Insufficient documentation

## 2024-04-20 DIAGNOSIS — R519 Headache, unspecified: Secondary | ICD-10-CM | POA: Diagnosis not present

## 2024-04-20 DIAGNOSIS — I6381 Other cerebral infarction due to occlusion or stenosis of small artery: Secondary | ICD-10-CM

## 2024-04-20 DIAGNOSIS — R202 Paresthesia of skin: Secondary | ICD-10-CM | POA: Diagnosis not present

## 2024-04-20 DIAGNOSIS — G43109 Migraine with aura, not intractable, without status migrainosus: Secondary | ICD-10-CM | POA: Diagnosis not present

## 2024-04-20 DIAGNOSIS — R29818 Other symptoms and signs involving the nervous system: Secondary | ICD-10-CM | POA: Diagnosis not present

## 2024-04-20 LAB — CBC
HCT: 46.8 % (ref 39.0–52.0)
Hemoglobin: 16.2 g/dL (ref 13.0–17.0)
MCH: 32.6 pg (ref 26.0–34.0)
MCHC: 34.6 g/dL (ref 30.0–36.0)
MCV: 94.2 fL (ref 80.0–100.0)
Platelets: 266 K/uL (ref 150–400)
RBC: 4.97 MIL/uL (ref 4.22–5.81)
RDW: 12.5 % (ref 11.5–15.5)
WBC: 10.6 K/uL — ABNORMAL HIGH (ref 4.0–10.5)
nRBC: 0 % (ref 0.0–0.2)

## 2024-04-20 LAB — DIFFERENTIAL
Abs Immature Granulocytes: 0.03 K/uL (ref 0.00–0.07)
Basophils Absolute: 0.1 K/uL (ref 0.0–0.1)
Basophils Relative: 1 %
Eosinophils Absolute: 0.3 K/uL (ref 0.0–0.5)
Eosinophils Relative: 2 %
Immature Granulocytes: 0 %
Lymphocytes Relative: 27 %
Lymphs Abs: 2.8 K/uL (ref 0.7–4.0)
Monocytes Absolute: 0.9 K/uL (ref 0.1–1.0)
Monocytes Relative: 9 %
Neutro Abs: 6.4 K/uL (ref 1.7–7.7)
Neutrophils Relative %: 61 %

## 2024-04-20 LAB — COMPREHENSIVE METABOLIC PANEL WITH GFR
ALT: 26 U/L (ref 0–44)
AST: 25 U/L (ref 15–41)
Albumin: 4.4 g/dL (ref 3.5–5.0)
Alkaline Phosphatase: 63 U/L (ref 38–126)
Anion gap: 9 (ref 5–15)
BUN: 15 mg/dL (ref 6–20)
CO2: 25 mmol/L (ref 22–32)
Calcium: 9.3 mg/dL (ref 8.9–10.3)
Chloride: 105 mmol/L (ref 98–111)
Creatinine, Ser: 1 mg/dL (ref 0.61–1.24)
GFR, Estimated: >60 mL/min (ref >60)
Glucose, Bld: 90 mg/dL (ref 70–99)
Potassium: 3.7 mmol/L (ref 3.5–5.1)
Sodium: 139 mmol/L (ref 135–145)
Total Bilirubin: 0.8 mg/dL (ref 0.0–1.2)
Total Protein: 7.9 g/dL (ref 6.5–8.1)

## 2024-04-20 LAB — PROTIME-INR
INR: 1 (ref 0.8–1.2)
Prothrombin Time: 13.7 s (ref 11.4–15.2)

## 2024-04-20 LAB — ETHANOL: Alcohol, Ethyl (B): <15 mg/dL (ref <15)

## 2024-04-20 LAB — CBG MONITORING, ED: Glucose-Capillary: 80 mg/dL (ref 70–99)

## 2024-04-20 LAB — APTT: aPTT: 29 s (ref 24–36)

## 2024-04-20 MED ORDER — SODIUM CHLORIDE 0.9 % IV BOLUS
1000.0000 mL | Freq: Once | INTRAVENOUS | Status: AC
  Administered 2024-04-20: 1000 mL via INTRAVENOUS

## 2024-04-20 MED ORDER — SODIUM CHLORIDE 0.9% FLUSH
3.0000 mL | Freq: Once | INTRAVENOUS | Status: AC
  Administered 2024-04-20: 3 mL via INTRAVENOUS

## 2024-04-20 MED ORDER — METOCLOPRAMIDE HCL 5 MG/ML IJ SOLN
10.0000 mg | Freq: Once | INTRAMUSCULAR | Status: AC
  Administered 2024-04-20: 10 mg via INTRAVENOUS
  Filled 2024-04-20: qty 2

## 2024-04-20 MED ORDER — MAGNESIUM SULFATE 2 GM/50ML IV SOLN
2.0000 g | Freq: Once | INTRAVENOUS | Status: AC
  Administered 2024-04-20: 2 g via INTRAVENOUS
  Filled 2024-04-20: qty 50

## 2024-04-20 MED ORDER — KETOROLAC TROMETHAMINE 15 MG/ML IJ SOLN
15.0000 mg | Freq: Once | INTRAMUSCULAR | Status: AC
  Administered 2024-04-20: 15 mg via INTRAVENOUS
  Filled 2024-04-20: qty 1

## 2024-04-20 MED ORDER — DIPHENHYDRAMINE HCL 50 MG/ML IJ SOLN
25.0000 mg | Freq: Once | INTRAMUSCULAR | Status: AC
  Administered 2024-04-20: 25 mg via INTRAVENOUS
  Filled 2024-04-20: qty 1

## 2024-04-20 MED ORDER — DEXAMETHASONE SODIUM PHOSPHATE 10 MG/ML IJ SOLN
10.0000 mg | Freq: Once | INTRAMUSCULAR | Status: AC
  Administered 2024-04-20: 10 mg via INTRAVENOUS
  Filled 2024-04-20: qty 1

## 2024-04-20 NOTE — Progress Notes (Signed)
  Chaplain On-Call responded to Code Stroke notification at 1542 hours.  The patient was at the CT Scan area for tests.  Chaplain assured ED Staff of availability as needed.  Chaplain Bebe Ardean EMERSON Hershal., [facility]

## 2024-04-20 NOTE — ED Notes (Signed)
 Pt back from CT

## 2024-04-20 NOTE — ED Notes (Signed)
LKW 1400

## 2024-04-20 NOTE — Consult Note (Signed)
 NEUROLOGY CONSULT NOTE   Date of service: April 20, 2024 Patient Name: Paul Newton MRN:  [id number] DOB:  1981-03-23 Chief Complaint: Left facial and arm numbness Requesting Provider: Jossie Artist [name], MD  History of Present Illness  Paul Newton is a 43 y.o. male with hx of recent episode of left facial and arm numbness deemed to be a possible TIA with initial concern for small thalamic infarct but MRI negative, came in with sudden onset of left face and arm numbness when he was working. 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  Suddenly started noticing numbness on the left forehead, left cheek and left lower part of the face going back to the left side of his scalp.  He also noticed some tingling on his left arm. He reports that he has been having headaches over the past 1 year the frequency of which has also been increasing over the past few months.  When he came the last time over a week ago, he had a headache then and he also has a headache today.   LKW: 2 PM Modified rankin score: 0-Completely asymptomatic and back to baseline post- stroke IV Thrombolysis: Too mild to treat EVT: Clinical exam not consistent with LVO NIH stroke scale-1 for sensory   ROS  Comprehensive ROS performed and pertinent positives documented in HPI   Past History   Past Medical History:  Diagnosis Date   Complication of anesthesia    slow to wake up   COVID 2020   mild case   GERD (gastroesophageal reflux disease)    Pneumonia     Past Surgical History:  Procedure Laterality Date   CLOSED REDUCTION METACARPAL WITH PERCUTANEOUS PINNING Right 03/18/2022   Procedure: closed reduction percutaneous pinning right ring finger and right small finger metacarpal fracture;  Surgeon: Sissy Cough, MD;  Location: [facility] OR;  Service: Orthopedics;  Laterality: Right;  Axillary block/ mac   LYMPHADENECTOMY     Right neck R/T swelling with airway compromise  during bout with pneumonia    Family History: Family History  Problem Relation Age of Onset   Arthritis Mother     Social History  reports that he has been smoking cigarettes. He has never used smokeless tobacco. He reports that he does not currently use alcohol. He reports that he does not currently use drugs.  Allergies  Allergen Reactions   Hyoscyamine Other (See Comments)    Felt like he couldn't swallow- made his throat unusually dry     Medications   Current Facility-Administered Medications:    dexamethasone (DECADRON) injection 10 mg, 10 mg, Intravenous, Once, Bradler, Evan K, MD   diphenhydrAMINE (BENADRYL) injection 25 mg, 25 mg, Intravenous, Once, Bradler, Evan K, MD   ketorolac  (TORADOL ) 15 MG/ML injection 15 mg, 15 mg, Intravenous, Once, Bradler, Evan K, MD   magnesium sulfate IVPB 2 g 50 mL, 2 g, Intravenous, Once, Bradler, Evan K, MD   metoCLOPramide (REGLAN) injection 10 mg, 10 mg, Intravenous, Once, Bradler, Evan K, MD   sodium chloride  0.9 % bolus 1,000 mL, 1,000 mL, Intravenous, Once, Bradler, Evan K, MD  Current Outpatient Medications:    aspirin  EC 81 MG tablet, Take 1 tablet (81 mg total) by mouth daily. Swallow whole., Disp: 90 tablet, Rfl: 0   atorvastatin  (LIPITOR) 40 MG tablet, Take 1 tablet (40 mg total) by mouth daily., Disp: 90 tablet, Rfl: 0   clopidogrel  (PLAVIX ) 75 MG tablet,  Take 1 tablet (75 mg total) by mouth daily for 21 days., Disp: 21 tablet, Rfl: 0  Vitals   Vitals:   2024/05/07 1535 05/07/2024 1536  BP: (!) 153/114   Pulse: 68   Resp: 20   Temp: 97.7 F (36.5 C)   SpO2: 98%   Weight:  88.5 kg  Height:  5' 4 (1.626 m)    Body mass index is 33.47 kg/m.   Physical Exam   General: Well-developed well-nourished man in no acute distress HEENT: Normocephalic/atraumatic Lungs: Clear Cardiovascular: Regular rate rhythm Neurological exam Awake alert oriented x 3 No aphasia No dysarthria Cranial nerves II to XII: Pupils equal  round react light, extract movements intact, visual fields full, facial sensation somewhat diminished to light touch on the left but to vibratory sense he has diminished sensation on the right.  Face is symmetric.  Tongue and palate midline. Motor examination reveals no drift in any of the 4 extremities Sensory exam is intact to light touch bilaterally but there is mild left upper extremity subjective feeling of tingling. Coordination examination reveals no dysmetria Gait testing was deferred  Labs/Imaging/Neurodiagnostic studies   CBC:  Recent Labs  Lab May 07, 2024 1540  WBC 10.6*  NEUTROABS 6.4  HGB 16.2  HCT 46.8  MCV 94.2  PLT 266   Basic Metabolic Panel:  Lab Results  Component Value Date   NA 139 04/10/2024   K 3.7 04/10/2024   CO2 21 (L) 04/10/2024   GLUCOSE 88 04/10/2024   BUN 14 04/10/2024   CREATININE 1.09 04/10/2024   CREATININE 1.06 04/10/2024   CALCIUM  9.1 04/10/2024   GFRNONAA >60 04/10/2024   GFRNONAA >60 04/10/2024   GFRAA >60 03/27/2015   Lipid Panel:  Lab Results  Component Value Date   LDLCALC 87 04/11/2024   HgbA1c:  Lab Results  Component Value Date   HGBA1C 4.8 04/10/2024   Urine Drug Screen:     Component Value Date/Time   LABOPIA NONE DETECTED 04/10/2024 1834   COCAINSCRNUR NONE DETECTED 04/10/2024 1834   LABBENZ NONE DETECTED 04/10/2024 1834   AMPHETMU NONE DETECTED 04/10/2024 1834   THCU POSITIVE (A) 04/10/2024 1834   LABBARB NONE DETECTED 04/10/2024 1834    Alcohol Level     Component Value Date/Time   ETH <15 04/10/2024 1624   INR  Lab Results  Component Value Date   INR 1.0 04/10/2024   APTT  Lab Results  Component Value Date   APTT 30 04/10/2024   CT Head without contrast(Personally reviewed): No acute findings  CT angio Head and Neck with contrast(Personally reviewed): Not performed due to low suspicion for LVO based on clinical exam  ASSESSMENT   Paul Newton is a 43 y.o. male discharged about 10 days ago  after receiving possible workup for thalamic stroke versus TIA with a negative MRI, now presenting with similar symptoms of left facial and arm numbness and tingling.  On further history taking, both these episodes were connected to a headache that preceded the symptoms. I am not truly sure if these are true TIAs versus if this could be complex migraine. Given the fact that he continued to have sensory complaints over a day after admission last time in spite of a negative MRI, it might be prudent to repeat MRI to make sure that a small thalamic lacunar infarct was not missed. If the MRI is negative, then treated as a complicated migraine.  RECOMMENDATIONS  Stat MRI brain If negative, no further workup If positive, may  need further addressing with stroke workup including hypercoagulable workup given young age. Migraine cocktail Plan discussed with Dr. Adine Please call with questions ______________________________________________________________________    Signed, Eligio Lav, MD [facility]

## 2024-04-20 NOTE — Progress Notes (Signed)
 CODE STROKE- PHARMACY COMMUNICATION   Time CODE STROKE called/page received:[id number]  Time response to CODE STROKE was made (in person or via phone): in-person  Name of Provider/Nurse contacted:Dr. Arora, K.   Past Medical History:  Diagnosis Date   Complication of anesthesia    slow to wake up   COVID 2020   mild case   GERD (gastroesophageal reflux disease)    Pneumonia    Prior to Admission medications   Medication Sig Start Date End Date Taking? Authorizing Provider  aspirin  EC 81 MG tablet Take 1 tablet (81 mg total) by mouth daily. Swallow whole. 04/11/24 07/10/24 Yes Lenon Marien CROME, MD  atorvastatin  (LIPITOR) 40 MG tablet Take 1 tablet (40 mg total) by mouth daily. 04/11/24 07/10/24 Yes Lenon Marien CROME, MD  clopidogrel  (PLAVIX ) 75 MG tablet Take 1 tablet (75 mg total) by mouth daily for 21 days. 04/11/24 05/02/24 Yes Lenon Marien CROME, MD  dicyclomine  (BENTYL ) 20 MG tablet Take 1 tablet (20 mg total) by mouth 2 (two) times daily. 02/14/12 07/06/12  Nguyen, Brigitte S, PA-C    Annabella LOISE Banks ,PharmD Clinical Pharmacist  04/20/2024  3:52 PM

## 2024-04-20 NOTE — Discharge Instructions (Addendum)
 Please use ibuprofen (Motrin) up to 800 mg every 8 hours, naproxen (Naprosyn) up to 500 mg every 12 hours, and/or acetaminophen (Tylenol) up to 4 g/day for any continued pain.  Please do not use this medication regimen for longer than 7 days

## 2024-04-20 NOTE — ED Provider Notes (Signed)
 [facility] Provider Note   Event Date/Time   First MD Initiated Contact with Patient 04/20/24 1542     (approximate) History  Code Stroke  HPI Paul Newton is a 43 y.o. male with a past medical history of hypertension, hyperlipidemia, emphysema, and GERD who presents complaining of headache, left facial numbness, left arm paresthesia and numbness that began at 1400 today.  Patient states he was recently discharged after being diagnosed with a TIA however states that his MRI was normal.  Patient also endorses increase in headaches recently however this is the first time that it has been accompanied with these neurologic symptoms.  Code stroke called upon arrival. ROS: Patient currently denies any vision changes, tinnitus, difficulty speaking, facial droop, sore throat, chest pain, shortness of breath, abdominal pain, nausea/vomiting/diarrhea, dysuria   Physical Exam  Triage Vital Signs: ED Triage Vitals  Encounter Vitals Group     BP 04/20/24 1535 (!) 153/114     Girls Systolic BP Percentile --      Girls Diastolic BP Percentile --      Boys Systolic BP Percentile --      Boys Diastolic BP Percentile --      Pulse Rate 04/20/24 1535 68     Resp 04/20/24 1535 20     Temp 04/20/24 1535 97.7 F (36.5 C)     Temp src --      SpO2 04/20/24 1535 98 %     Weight 04/20/24 1536 195 lb (88.5 kg)     Height 04/20/24 1536 5' 4 (1.626 m)     Head Circumference --      Peak Flow --      Pain Score 04/20/24 1536 0     Pain Loc --      Pain Education --      Exclude from Growth Chart --    Most recent vital signs: Vitals:   04/20/24 1830 04/20/24 1900  BP: 129/67 (!) 139/93  Pulse: 72 70  Resp: 19 14  Temp:    SpO2: 99% 100%   General: Awake, oriented x4. CV:  Good peripheral perfusion. Resp:  Normal effort. Abd:  No distention. Other:  Middle-aged obese Caucasian male resting comfortably in no acute distress.  Decreased sensation over left face and  left upper extremity ED Results / Procedures / Treatments  Labs (all labs ordered are listed, but only abnormal results are displayed) Labs Reviewed  CBC - Abnormal; Notable for the following components:      Result Value   WBC 10.6 (*)    All other components within normal limits  PROTIME-INR  APTT  DIFFERENTIAL  COMPREHENSIVE METABOLIC PANEL WITH GFR  ETHANOL  CBG MONITORING, ED   EKG ED ECG REPORT I, Artist [name] Kerns, the attending physician, personally viewed and interpreted this ECG. Date: 04/20/2024 EKG Time: 1555 Rate: 68 Rhythm: normal sinus rhythm QRS Axis: normal Intervals: normal ST/T Wave abnormalities: normal Narrative Interpretation: no evidence of acute ischemia RADIOLOGY ED MD interpretation: CT of the head without contrast interpreted by me shows no evidence of acute abnormalities including no intracerebral hemorrhage, obvious masses, or significant edema - All radiology independently interpreted and agree with radiology assessment Official radiology report(s): MR BRAIN WO CONTRAST Result Date: 04/20/2024 EXAM: MRI BRAIN WITHOUT CONTRAST 04/20/2024 04:50:38 PM TECHNIQUE: Multiplanar multisequence MRI of the head/brain was performed without the administration of intravenous contrast. COMPARISON: Same day CT head and MRI head 04/10/2024. CLINICAL HISTORY: Headache, neuro deficit. Left  facial and arm numbness. Follow up exam. Prior exam in pacs from 9/22. FINDINGS: BRAIN AND VENTRICLES: No acute infarct. No intracranial hemorrhage. No mass. No midline shift. No hydrocephalus. The sella is unremarkable. Normal flow voids. ORBITS: No acute abnormality. SINUSES AND MASTOIDS: No acute abnormality. BONES AND SOFT TISSUES: Normal marrow signal. Mildly prominent left lateral retropharyngeal lymph node measuring up to 6 mm in short axis. Recommend nonemergent correlation with CT neck soft tissue with contrast. Small mucous retention cyst within the nasopharyngeal mucosa.  IMPRESSION: 1. No acute intracranial abnormality. 2. Mildly prominent left lateral retropharyngeal lymph node measuring up to 6 mm in short axis. Recommend nonemergent correlation with CT neck soft tissue with contrast. Electronically signed by: Donnice Mania MD 04/20/2024 05:35 PM EDT RP Workstation: [id number]   CT HEAD CODE STROKE WO CONTRAST Result Date: 04/20/2024 CLINICAL DATA:  Code stroke. Neuro deficit, acute, stroke suspected. Left facial numbness. Left arm tingling and numbness. EXAM: CT HEAD WITHOUT CONTRAST TECHNIQUE: Contiguous axial images were obtained from the base of the skull through the vertex without intravenous contrast. RADIATION DOSE REDUCTION: This exam was performed according to the departmental dose-optimization program which includes automated exposure control, adjustment of the mA and/or kV according to patient size and/or use of iterative reconstruction technique. COMPARISON:  Brain MRI 04/10/2024. FINDINGS: Brain: Cerebral volume is normal. There is no acute intracranial hemorrhage. No demarcated cortical infarct. No extra-axial fluid collection. No evidence of an intracranial mass. No midline shift. Vascular: No hyperdense vessel. Skull: No calvarial fracture or aggressive osseous lesion. Sinuses/Orbits: No mass or acute finding within the imaged orbits. No significant paranasal sinus disease at the imaged levels. ASPECTS [facility] Stroke Program Early CT Score) - Ganglionic level infarction (caudate, lentiform nuclei, internal capsule, insula, M1-M3 cortex): 7 - Supraganglionic infarction (M4-M6 cortex): 3 Total score (0-10 with 10 being normal): 10 No evidence of an acute intracranial abnormality. These results were communicated to Dr. Voncile at 3:55 pmon 10/2/2025by text page via the [facility] messaging system. IMPRESSION: No evidence of an acute intracranial abnormality. Electronically Signed   By: Rockey Childs D.O.   On: 04/20/2024 15:55   PROCEDURES: Critical Care performed:  No Procedures MEDICATIONS ORDERED IN ED: Medications  sodium chloride  flush (NS) 0.9 % injection 3 mL (3 mLs Intravenous Given 04/20/24 1555)  sodium chloride  0.9 % bolus 1,000 mL (0 mLs Intravenous Stopped 04/20/24 1845)  ketorolac  (TORADOL ) 15 MG/ML injection 15 mg (15 mg Intravenous Given 04/20/24 1601)  diphenhydrAMINE (BENADRYL) injection 25 mg (25 mg Intravenous Given 04/20/24 1600)  dexamethasone (DECADRON) injection 10 mg (10 mg Intravenous Given 04/20/24 1602)  metoCLOPramide (REGLAN) injection 10 mg (10 mg Intravenous Given 04/20/24 1601)  magnesium sulfate IVPB 2 g 50 mL (0 g Intravenous Stopped 04/20/24 1745)   IMPRESSION / MDM / ASSESSMENT AND PLAN / ED COURSE  I reviewed the triage vital signs and the nursing notes.                             The patient is on the cardiac monitor to evaluate for evidence of arrhythmia and/or significant heart rate changes. Patient's presentation is most consistent with acute presentation with potential threat to life or bodily function. Patient is a 43 year old male with the above-stated past medical history who presents complaining of headache, left-sided facial numbness, and left upper extremity numbness that began at 1400 today.  Code stroke called in triage DDx: CVA, TIA, complex migraine, cervical myelopathy  Plan: CBC, CMP, INR, ethanol, CT head, MRI brain Migraine cocktail  In discussion with Dr. Voncile who is on for stroke alert, concerned that patient may be having complex migraine rather than acute CVA or TIA.  Recommends headache cocktail as well as MRI brain and likely discharge if patient symptoms resolved after migraine cocktail with negative MRI.  Upon reassessment, patient given news of MRI that is negative for any acute abnormalities.  Patient states his symptoms have almost completely resolved with this migraine cocktail other than a mild 2/10 headache behind the left eye.  Patient at [facility] plan for discharge at this time and  follow-up in the outpatient setting with neurology for possibly starting migraine medications on a daily basis.  Patient given strict return precautions and all questions answered prior to discharge  Dispo: Discharge home with neurology and PCP follow-up   FINAL CLINICAL IMPRESSION(S) / ED DIAGNOSES   Final diagnoses:  Left facial numbness  Left upper extremity numbness  Other migraine without status migrainosus, not intractable   Rx / DC Orders   ED Discharge Orders     None      Note:  This document was prepared using Dragon voice recognition software and may include unintentional dictation errors.   Jossie Artist POUR, MD 04/20/24 2136

## 2024-04-20 NOTE — ED Notes (Signed)
Cbg 80. ?

## 2024-04-20 NOTE — ED Notes (Signed)
 [facility] called for codestroke per RN [employer]

## 2024-04-20 NOTE — ED Triage Notes (Signed)
 Pt to ED for left facial numbness and left arm tingling and numbness. LKW 1400. Recently d/c for TIA

## 2024-04-20 NOTE — Telephone Encounter (Signed)
  FYI Only or Action Required?: FYI only for provider.  Patient was last seen in primary care on 04/13/2024 by Chandra Toribio POUR, MD.  Called Nurse Triage reporting Neurologic Problem.  Symptoms began today.  Interventions attempted: Nothing.  Symptoms are: unchanged.  Triage Disposition: Go to ED Now (Notify PCP) (overriding See HCP Within 4 Hours (Or PCP Triage))  Patient/caregiver understands and will follow disposition?: yes   Copied from CRM [id number]. Topic: Clinical - Red Word Triage >> Apr 20, 2024  2:22 PM Selinda RAMAN wrote: Red Word that prompted transfer to Nurse Triage: Silvano the spouse of the patient called in stating he had a TIA or stroke last week and since then has gone back to work. She states he told her he has had tingling in his left arm today over the last few hours and wants to know if he should be concerned and what he should do. He would like a call back from a nurse to discuss further. Please assist patient Reason for Disposition  [1] Tingling (e.g., pins and needles) of the face, arm / hand, or leg / foot on one side of the body AND [2] present now  (Exceptions: Chronic/recurrent symptom lasting > 4 weeks; or from known cause, such as: bumped elbow, carpal tunnel, pinched nerve.)  Answer Assessment - Initial Assessment Questions Additional info: 04/10/24 was evaluated in ER for chest pain, left arm weakness, left facial numbness, he was diagnosed with TIA. He has had his hospital follow up visit.  This Clinical research associate spoke with patient who stated he is currently at work and began with tingling in his left arm and warm sensation to left face cheek approximately two hours ago, facial sensation seems to be resolving but tingling in hand is persisting. Due to recent TIA and concern for stroke this writer advised ER evaluation, patient in agreement with ER.     1. SYMPTOM: What is the main symptom you are concerned about? (e.g., weakness, numbness)     Left arm tingling  2.  ONSET: When did this start? (e.g., minutes, hours, days; while sleeping)     Couple hours ago 3. LAST NORMAL: When was the last time you (the patient) were normal (no symptoms)?     Couple hours ago unsure of specific time, he is at work.  4. PATTERN Does this come and go, or has it been constant since it started?  Is it present now?     Constant 5. CARDIAC SYMPTOMS: Have you had any of the following symptoms: chest pain, difficulty breathing, palpitations?     Not at this time 6. NEUROLOGIC SYMPTOMS: Have you had any of the following symptoms: headache, dizziness, vision loss, double vision, changes in speech, unsteady on your feet?     Left sided face-cheek-has warm sensation 7. OTHER SYMPTOMS: Do you have any other symptoms?     no  Protocols used: Neurologic Deficit-A-AH

## 2024-04-20 NOTE — Code Documentation (Addendum)
 Stroke Response Nurse Documentation Code Documentation  EZARIAH NACE is a 43 y.o. male arriving to [facility] via [employer] on 04/20/2024 with past medical hx of recent hospitalization for TIA. On aspirin  81 mg daily. Code stroke was activated by ED.   Patient from home where he was LKW at 1400 and now complaining of left arm and face tingling, numbness. Patient reports he was doing work at home when he had sudden onset of left face and arm numbness and came to ED. Reports arm numbness and tingling have resolved at time of exam, but facial numbness still present.  Stroke team at the bedside on patient arrival. Labs drawn and patient cleared for CT by Dr. Jossie. Patient to CT with team. NIHSS 1, see documentation for details and code stroke times. Patient with left decreased sensation on exam. The following imaging was completed:  CT Head. Patient is not a candidate for IV Thrombolytic due to too mild to treat, per MD. Patient is not a candidate for IR due to exam not consistent with LVO, per MD.   Care Plan: NIHSS every 30 minutes until patient outside window at 1830. Reactivate code stroke if symptoms worsen.  Process Delays Noted: none  Bedside handoff with ED RN Hailey S.    Approximate time spent with patient: 20 minutes  Burnard KANDICE Bras  Stroke Response RN

## 2024-04-20 NOTE — ED Notes (Signed)
Pt to ct 2

## 2024-04-21 DIAGNOSIS — G43109 Migraine with aura, not intractable, without status migrainosus: Secondary | ICD-10-CM

## 2024-04-23 ENCOUNTER — Telehealth: Admitting: Family

## 2024-04-23 DIAGNOSIS — T148XXA Other injury of unspecified body region, initial encounter: Secondary | ICD-10-CM

## 2024-04-23 DIAGNOSIS — R109 Unspecified abdominal pain: Secondary | ICD-10-CM

## 2024-04-23 DIAGNOSIS — K921 Melena: Secondary | ICD-10-CM

## 2024-04-23 NOTE — Progress Notes (Signed)
 Virtual Visit Consent   Paul Newton, you are scheduled for a virtual visit with a [facility] provider today. Just as with appointments in the office, your consent must be obtained to participate. Your consent will be active for this visit and any virtual visit you may have with one of our providers in the next 365 days. If you have a MyChart account, a copy of this consent can be sent to you electronically.  As this is a virtual visit, video technology does not allow for your provider to perform a traditional examination. This may limit your provider's ability to fully assess your condition. If your provider identifies any concerns that need to be evaluated in person or the need to arrange testing (such as labs, EKG, etc.), we will make arrangements to do so. Although advances in technology are sophisticated, we cannot ensure that it will always work on either your end or our end. If the connection with a video visit is poor, the visit may have to be switched to a telephone visit. With either a video or telephone visit, we are not always able to ensure that we have a secure connection.  By engaging in this virtual visit, you consent to the provision of healthcare and authorize for your insurance to be billed (if applicable) for the services provided during this visit. Depending on your insurance coverage, you may receive a charge related to this service.  I need to obtain your verbal consent now. Are you willing to proceed with your visit today? Dayyan Krist Carpenito has provided verbal consent on 04/23/2024 for a virtual visit (video or telephone). Bari Learn, FNP  Date: 04/23/2024 12:58 PM   Virtual Visit via Video Note   I, Bari Learn, connected with  Paul Newton  ([id number], Aug 16, 1980) on 04/23/24 at  1:00 PM EDT by a video-enabled telemedicine application and verified that I am speaking with the correct person using two identifiers.  Location: Patient: Virtual Visit Location  Patient: Home Provider: Virtual Visit Location Provider: Home Office   I discussed the limitations of evaluation and management by telemedicine and the availability of in person appointments. The patient expressed understanding and agreed to proceed.    History of Present Illness: Paul Newton is a 43 y.o. who identifies as a male who was assigned male at birth, and is being seen today for abdominal pain that started two days ago. Has noticed blood in his stool and bruising on his abdomen. Denies any injury on his abdominal. He was recently placed on Plavix .   HPI: Abdominal Pain This is a new problem. The current episode started in the past 7 days. The problem occurs intermittently. The pain is at a severity of 8/10. The pain is mild. Quality: bloating.    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 Swallow whole., Disp: 90 tablet, Rfl: 0   atorvastatin  (LIPITOR) 40 MG tablet, Take 1 tablet (40 mg total) by mouth daily., Disp: 90 tablet, Rfl: 0   clopidogrel  (PLAVIX ) 75 MG tablet, Take 1 tablet (75 mg total) by mouth daily for 21 days., Disp: 21 tablet, Rfl: 0  Observations/Objective: Patient is well-developed, well-nourished in no acute distress.   Resting comfortably  at home.  Head is normocephalic, atraumatic.  No labored breathing.  Speech is clear and coherent with logical content.  Patient is alert and oriented at baseline.  Bruising on left lower abdominal area  Assessment and Plan: 1. Abdominal pain, unspecified abdominal location (Primary)  2. Blood in stool  3. Bruising  Given recent start on Plavix  needs to be seen in person Recommend follow up at Urgent Care to have Hgb checked and check abdominal pain   Follow Up Instructions: I discussed the assessment and treatment plan with the patient. The patient was provided an opportunity to ask questions and all were answered. The patient agreed with the plan and demonstrated an understanding of the instructions.  A copy of instructions were sent to the patient via MyChart unless otherwise noted below.     The patient was advised to call back or seek an in-person evaluation if the symptoms worsen or if the condition fails to improve as anticipated.    Bari Learn, FNP

## 2024-04-24 ENCOUNTER — Emergency Department: Admission: EM | Admit: 2024-04-24 | Discharge: 2024-04-25 | Disposition: A | Discharge status: Home / Self Care

## 2024-04-24 ENCOUNTER — Other Ambulatory Visit: Payer: Self-pay

## 2024-04-24 ENCOUNTER — Ambulatory Visit: Payer: Self-pay

## 2024-04-24 DIAGNOSIS — R109 Unspecified abdominal pain: Secondary | ICD-10-CM | POA: Diagnosis not present

## 2024-04-24 DIAGNOSIS — Z7901 Long term (current) use of anticoagulants: Secondary | ICD-10-CM | POA: Insufficient documentation

## 2024-04-24 DIAGNOSIS — R1084 Generalized abdominal pain: Secondary | ICD-10-CM | POA: Diagnosis not present

## 2024-04-24 DIAGNOSIS — I10 Essential (primary) hypertension: Secondary | ICD-10-CM | POA: Diagnosis not present

## 2024-04-24 DIAGNOSIS — K2980 Duodenitis without bleeding: Secondary | ICD-10-CM | POA: Diagnosis not present

## 2024-04-24 DIAGNOSIS — K760 Fatty (change of) liver, not elsewhere classified: Secondary | ICD-10-CM | POA: Diagnosis not present

## 2024-04-24 DIAGNOSIS — D72829 Elevated white blood cell count, unspecified: Secondary | ICD-10-CM | POA: Diagnosis not present

## 2024-04-24 LAB — COMPREHENSIVE METABOLIC PANEL WITH GFR
ALT: 26 U/L (ref 0–44)
AST: 18 U/L (ref 15–41)
Albumin: 3.8 g/dL (ref 3.5–5.0)
Alkaline Phosphatase: 54 U/L (ref 38–126)
Anion gap: 11 (ref 5–15)
BUN: 13 mg/dL (ref 6–20)
CO2: 25 mmol/L (ref 22–32)
Calcium: 9.2 mg/dL (ref 8.9–10.3)
Chloride: 104 mmol/L (ref 98–111)
Creatinine, Ser: 1.16 mg/dL (ref 0.61–1.24)
GFR, Estimated: >60 mL/min (ref >60)
Glucose, Bld: 92 mg/dL (ref 70–99)
Potassium: 4.3 mmol/L (ref 3.5–5.1)
Sodium: 140 mmol/L (ref 135–145)
Total Bilirubin: 0.4 mg/dL (ref 0.0–1.2)
Total Protein: 7.6 g/dL (ref 6.5–8.1)

## 2024-04-24 LAB — CBC
HCT: 44.1 % (ref 39.0–52.0)
Hemoglobin: 15.2 g/dL (ref 13.0–17.0)
MCH: 32.7 pg (ref 26.0–34.0)
MCHC: 34.5 g/dL (ref 30.0–36.0)
MCV: 94.8 fL (ref 80.0–100.0)
Platelets: 296 K/uL (ref 150–400)
RBC: 4.65 MIL/uL (ref 4.22–5.81)
RDW: 12.6 % (ref 11.5–15.5)
WBC: 14.4 K/uL — ABNORMAL HIGH (ref 4.0–10.5)
nRBC: 0 % (ref 0.0–0.2)

## 2024-04-24 LAB — TYPE AND SCREEN

## 2024-04-24 MED ORDER — MORPHINE SULFATE (PF) 4 MG/ML IV SOLN
4.0000 mg | Freq: Once | INTRAVENOUS | Status: AC
  Administered 2024-04-25: 4 mg via INTRAVENOUS
  Filled 2024-04-24: qty 1

## 2024-04-24 MED ORDER — LACTATED RINGERS IV BOLUS
1000.0000 mL | Freq: Once | INTRAVENOUS | Status: AC
  Administered 2024-04-25: 1000 mL via INTRAVENOUS

## 2024-04-24 MED ORDER — ONDANSETRON HCL 4 MG/2ML IJ SOLN
4.0000 mg | Freq: Once | INTRAMUSCULAR | Status: AC
  Administered 2024-04-25: 4 mg via INTRAVENOUS
  Filled 2024-04-24: qty 2

## 2024-04-24 NOTE — ED Provider Notes (Signed)
 [facility] Provider Note    Event Date/Time   First MD Initiated Contact with Patient 04/24/24 2304     (approximate)   History   Chief Complaint Abdominal Pain   HPI  Paul Newton is a 43 y.o. male with past medical history of hypertension, hyperlipidemia, complicated migraine, and TIA who presents to the ED complaining of abdominal pain.  Patient reports that he has had 5 days of increasing pain across his abdomen diffusely.  He describes the pain as crampy, not exacerbated or alleviated by anything radicular.  He states he had some bright red blood in his stool 3 days ago, but has not noticed any additional bleeding since then and denies any dark tarry stool.  He does note some bruising around the left lower quadrant of his abdomen with no recent trauma.  He has not had any nausea or vomiting and has been eating and drinking normally.  He also denies any fevers, dysuria, or flank pain.  He was started on Plavix  2 weeks ago for recent TIA.     Physical Exam   Triage Vital Signs: ED Triage Vitals  Encounter Vitals Group     BP 04/24/24 1748 (!) 162/101     Girls Systolic BP Percentile --      Girls Diastolic BP Percentile --      Boys Systolic BP Percentile --      Boys Diastolic BP Percentile --      Pulse Rate 04/24/24 1748 65     Resp 04/24/24 1748 16     Temp 04/24/24 1748 98.2 F (36.8 C)     Temp Source 04/24/24 1748 Oral     SpO2 04/24/24 1748 98 %     Weight 04/24/24 1750 194 lb 0.1 oz (88 kg)     Height 04/24/24 1750 5' 4 (1.626 m)     Head Circumference --      Peak Flow --      Pain Score 04/24/24 1750 10     Pain Loc --      Pain Education --      Exclude from Growth Chart --     Most recent vital signs: Vitals:   04/24/24 2207 04/24/24 2247  BP:  (!) 151/100  Pulse:  66  Resp:  16  Temp:  98 F (36.7 C)  SpO2: 100% 100%    Constitutional: Alert and oriented. Eyes: Conjunctivae are normal. Head: Atraumatic. Nose:  No congestion/rhinnorhea. Mouth/Throat: Mucous membranes are moist.  Cardiovascular: Normal rate, regular rhythm. Grossly normal heart sounds.  2+ radial pulses bilaterally. Respiratory: Normal respiratory effort.  No retractions. Lungs CTAB. Gastrointestinal: Soft and diffusely tender to palpation with no rebound or guarding. No distention. Musculoskeletal: No lower extremity tenderness nor edema.  Neurologic:  Normal speech and language. No gross focal neurologic deficits are appreciated.    ED Results / Procedures / Treatments   Labs (all labs ordered are listed, but only abnormal results are displayed) Labs Reviewed  CBC - Abnormal; Notable for the following components:      Result Value   WBC 14.4 (*)    All other components within normal limits  COMPREHENSIVE METABOLIC PANEL WITH GFR  POC OCCULT BLOOD, ED  TYPE AND SCREEN  TYPE AND SCREEN    RADIOLOGY ***  PROCEDURES:  Critical Care performed: {CriticalCareYesNo:19197::Yes, see critical care procedure note(s),No}  Procedures   MEDICATIONS ORDERED IN ED: Medications - No data to display   IMPRESSION /  MDM / ASSESSMENT AND PLAN / ED COURSE  I reviewed the triage vital signs and the nursing notes.                              43 y.o. male with past medical history of hypertension, hyperlipidemia, migraines, and TIA on Plavix  who presents to the ED complaining of diffuse abdominal pain increasing over the past 5 days with isolated episode of bright red blood in his stool.  Patient's presentation is most consistent with acute presentation with potential threat to life or bodily function.  Differential diagnosis includes, but is not limited to, gastritis, GERD, GI bleed, pancreatitis, hepatitis, cholecystitis, biliary colic, diverticulitis, appendicitis, colitis.  Patient nontoxic-appearing and in no acute distress, vital signs are unremarkable.  His abdomen is soft but diffusely tender to palpation, will further  assess with CT imaging.  He reports isolated episode of bright red blood in his stool, hemoglobin is stable and I doubt significant GI bleed.  He does have mild leukocytosis, no significant electrolyte abnormality or AKI noted.  LFTs are unremarkable, lipase pending.  We will treat symptomatically with IV morphine  and Zofran , hydrate with IV fluids and reassess following imaging.  {**The patient is on the cardiac monitor to evaluate for evidence of arrhythmia and/or significant heart rate changes.**}      FINAL CLINICAL IMPRESSION(S) / ED DIAGNOSES   Final diagnoses:  None     Rx / DC Orders   ED Discharge Orders     None        Note:  This document was prepared using Dragon voice recognition software and may include unintentional dictation errors.

## 2024-04-24 NOTE — Telephone Encounter (Signed)
 Triaged to ED d/t severity of pain, increased bruising and dizziness/weakness.  FYI Only or Action Required?: FYI only for provider.  Patient was last seen in primary care on 04/23/2024 by Lavell Bari LABOR, FNP.  Called Nurse Triage reporting Abdominal Pain and Bleeding/Bruising.  Symptoms began several days ago.  Interventions attempted: Rest, hydration, or home remedies.  Symptoms are: rapidly worsening.  Triage Disposition: Go to ED Now (Notify PCP)  Patient/caregiver understands and will follow disposition?: Yes  Reason for Disposition  [1] SEVERE pain (e.g., excruciating) AND [2] present > 1 hour  Answer Assessment - Initial Assessment Questions Patient reports no improvement of abdominal pain, bruising and gas. Blood in stool has resolved. Did not go to UC as instructed during virtual visit. Abdominal pain is intermittent, is a 10/10 at worst. Patient reports a little dizziness. Reports left sided abdominal bruising yesterday, atraumatic. Today has developed multiple additional bruises on his right abdomen. Denies hematochezia, melena or stool that looks like coffee grinds.  Due to symptoms increasing, pt was agreeable to going to the Emergency Department today. Will call once discharged to f/u with PCP.  1. LOCATION: Where does it hurt?      Mid-abdomen   2. RADIATION: Does the pain shoot anywhere else? (e.g., chest, back)     Radiates to other areas of the abdomen  3. ONSET: When did the pain begin? (Minutes, hours or days ago)      8 days  4. SUDDEN: Gradual or sudden onset?     Suddenly  5. PATTERN Does the pain come and go, or is it constant?     Comes and goes  6. SEVERITY: How bad is the pain?  (e.g., Scale 1-10; mild, moderate, or severe)     Up to 10/10  Protocols used: Abdominal Pain - Male-A-AH Message from Kevelyn M sent at 04/24/2024  3:48 PM EDT  Patient wife called in and the patient is experiencing possible side effects from Asprin,  atorvastatin , or clopidogrel . Side effects- bruising on stomach, bloated, stomach pain and pressure. Wife advised to call patient back.  Patient's # [phone]

## 2024-04-24 NOTE — ED Triage Notes (Signed)
 Pt was here last week for a TIA, was put on Plavix  and pt has been having abdominal pain, bruising, and bright red blood in stool. Pt is AOX4, NAD noted. Pt denies SHOB, reports headaches and dizziness.

## 2024-04-25 ENCOUNTER — Emergency Department

## 2024-04-25 DIAGNOSIS — K2980 Duodenitis without bleeding: Secondary | ICD-10-CM | POA: Diagnosis not present

## 2024-04-25 DIAGNOSIS — K760 Fatty (change of) liver, not elsewhere classified: Secondary | ICD-10-CM | POA: Diagnosis not present

## 2024-04-25 DIAGNOSIS — R109 Unspecified abdominal pain: Secondary | ICD-10-CM | POA: Diagnosis not present

## 2024-04-25 LAB — LIPASE, BLOOD: Lipase: 41 U/L (ref 11–51)

## 2024-04-25 MED ORDER — IOHEXOL 300 MG/ML  SOLN
100.0000 mL | Freq: Once | INTRAMUSCULAR | Status: AC | PRN
Start: 2024-04-25 — End: 2024-04-25
  Administered 2024-04-25: 100 mL via INTRAVENOUS

## 2024-04-25 MED ORDER — PANTOPRAZOLE SODIUM 40 MG PO TBEC: 40.0000 mg | DELAYED_RELEASE_TABLET | Freq: Every day | ORAL | 0 refills | Status: DC

## 2024-04-25 MED ORDER — ONDANSETRON 4 MG PO TBDP: 4.0000 mg | ORAL_TABLET | Freq: Three times a day (TID) | ORAL | 0 refills | Status: AC | PRN

## 2024-05-26 ENCOUNTER — Encounter: Payer: Self-pay | Admitting: Family Medicine

## 2024-05-26 ENCOUNTER — Telehealth (INDEPENDENT_AMBULATORY_CARE_PROVIDER_SITE_OTHER): Admitting: Family Medicine

## 2024-05-26 DIAGNOSIS — R062 Wheezing: Secondary | ICD-10-CM | POA: Diagnosis not present

## 2024-05-26 DIAGNOSIS — J0110 Acute frontal sinusitis, unspecified: Secondary | ICD-10-CM

## 2024-05-26 MED ORDER — AZITHROMYCIN 250 MG PO TABS: ORAL_TABLET | ORAL | 0 refills | Status: DC

## 2024-05-26 NOTE — Assessment & Plan Note (Signed)
 Wheezing and shortness of breath Reports wheezing and shortness of breath. No current inhaler use. Further evaluation if symptoms worsen to rule out pneumonia. - Advised follow-up with primary care for increased symptoms. - Consider chest x-ray if pneumonia suspected. - Consider inhaler if symptoms worsen.

## 2024-05-26 NOTE — Assessment & Plan Note (Signed)
 Acute frontal sinusitis Symptoms began on Sunday. Prescribed Z-Pak due to upcoming weekend. No antibiotic allergies. - Prescribed Z-Pak (azithromycin). - Advised increased fluid intake and rest.

## 2024-05-26 NOTE — Progress Notes (Signed)
 Virtual Visit via Video Note   This visit type was conducted per patient request This format is felt to be appropriate for this patient at this time.  All issues noted in this document were discussed and addressed.  A limited physical exam was performed with this format.  A verbal consent was obtained for the virtual visit.   Date:  05/26/2024   ID:  Paul Newton, DOB [birth year], MRN [id number]  Patient Location: Home Provider Location: Office/Clinic  PCP:  Chandra Toribio [name], MD   Chief Complaint  Patient presents with   Sinusitis     History of Present Illness:    Discussed the use of AI scribe software for clinical note transcription with the patient, who gave verbal consent to proceed.  History of Present Illness   Paul Newton is a 43 year old male who presents with symptoms of a sinus infection.  Upper respiratory symptoms - Onset of symptoms Sunday following his son's similar illness the prior week - Symptoms are atypical compared to prior episodes - Utilizes DayQuil and NyQuil for symptom management - Does not routinely use antihistamines such as Zyrtec or Claritin for seasonal allergies  Lower respiratory symptoms - Wheezing and shortness of breath present - Symptoms are not typical for him - No use of inhaler for these symptoms    Past Medical History:  Diagnosis Date   Complication of anesthesia    slow to wake up   COVID 2020   mild case   GERD (gastroesophageal reflux disease)    Pneumonia     Past Surgical History:  Procedure Laterality Date   CLOSED REDUCTION METACARPAL WITH PERCUTANEOUS PINNING Right 03/18/2022   Procedure: closed reduction percutaneous pinning right ring finger and right small finger metacarpal fracture;  Surgeon: Sissy Cough, MD;  Location: [facility] OR;  Service: Orthopedics;  Laterality: Right;  Axillary block/ mac   LYMPHADENECTOMY     Right neck R/T swelling with airway compromise during bout with pneumonia    Family  History  Problem Relation Age of Onset   Arthritis Mother     Social History   Socioeconomic History   Marital status: Single    Spouse name: Not on file   Number of children: Not on file   Years of education: Not on file   Highest education level: Not on file  Occupational History   Not on file  Tobacco Use   Smoking status: Some Days    Current packs/day: 0.25    Types: Cigarettes   Smokeless tobacco: Never  Vaping Use   Vaping status: Never Used  Substance and Sexual Activity   Alcohol use: Not Currently    Comment: occasional   Drug use: Not Currently   Sexual activity: Yes  Other Topics Concern   Not on file  Social History Narrative   Not on file   Social Drivers of Health   Financial Resource Strain: Not on file  Food Insecurity: No Food Insecurity (04/10/2024)   Hunger Vital Sign    Worried About Running Out of Food in the Last Year: Never true    Ran Out of Food in the Last Year: Never true  Transportation Needs: No Transportation Needs (04/10/2024)   PRAPARE - Administrator, Civil Service (Medical): No    Lack of Transportation (Non-Medical): No  Physical Activity: Not on file  Stress: Not on file  Social Connections: Not on file  Intimate Partner Violence: Not At Risk (04/10/2024)  Humiliation, Afraid, Rape, and Kick questionnaire    Fear of Current or Ex-Partner: No    Emotionally Abused: No    Physically Abused: No    Sexually Abused: No    Outpatient Medications Prior to Visit  Medication Sig Dispense Refill   aspirin  EC 81 MG tablet Take 1 tablet (81 mg total) by mouth daily. Swallow whole. 90 tablet 0   atorvastatin  (LIPITOR) 40 MG tablet Take 1 tablet (40 mg total) by mouth daily. 90 tablet 0   ondansetron  (ZOFRAN -ODT) 4 MG disintegrating tablet Take 1 tablet (4 mg total) by mouth every 8 (eight) hours as needed for nausea or vomiting. 12 tablet 0   pantoprazole  (PROTONIX ) 40 MG tablet Take 1 tablet (40 mg total) by mouth daily for  14 days. 14 tablet 0   No facility-administered medications prior to visit.    Allergies  Allergen Reactions   Hyoscyamine Other (See Comments)    Felt like he couldn't swallow- made his throat unusually dry      Social History   Tobacco Use   Smoking status: Some Days    Current packs/day: 0.25    Types: Cigarettes   Smokeless tobacco: Never  Vaping Use   Vaping status: Never Used  Substance Use Topics   Alcohol use: Not Currently    Comment: occasional   Drug use: Not Currently     Review of Systems  Constitutional:  Positive for diaphoresis. Negative for chills, fever and malaise/fatigue.  HENT:  Positive for congestion, ear pain (monday), sinus pain and sore throat.        Runny nose  Respiratory:  Positive for cough and wheezing. Negative for sputum production and shortness of breath.   Cardiovascular:  Negative for chest pain.  Gastrointestinal:  Positive for diarrhea.  Musculoskeletal:  Negative for joint pain and myalgias.  Skin: Negative.   Neurological:  Positive for headaches.  Psychiatric/Behavioral:  The patient does not have insomnia.      Labs/Other Tests and Data Reviewed:    Recent Labs: 04/10/2024: TSH 1.457 04/24/2024: ALT 26; BUN 13; Creatinine, Ser 1.16; Hemoglobin 15.2; Platelets 296; Potassium 4.3; Sodium 140   Recent Lipid Panel Lab Results  Component Value Date/Time   CHOL 161 04/11/2024 04:20 AM   CHOL 226 (H) 07/22/2023 09:39 AM   TRIG 187 (H) 04/11/2024 04:20 AM   HDL 37 (L) 04/11/2024 04:20 AM   HDL 41 07/22/2023 09:39 AM   CHOLHDL 4.4 04/11/2024 04:20 AM   LDLCALC 87 04/11/2024 04:20 AM   LDLCALC 166 (H) 07/22/2023 09:39 AM    Wt Readings from Last 3 Encounters:  04/24/24 194 lb 0.1 oz (88 kg)  04/20/24 195 lb (88.5 kg)  04/13/24 194 lb 1.9 oz (88.1 kg)     Objective:    Vital Signs:  There were no vitals taken for this visit.   Physical Exam Eyes:     Conjunctiva/sclera: Conjunctivae normal.  Pulmonary:     Effort:  No respiratory distress.  Neurological:     Mental Status: He is alert.    Unable to perform a complete physical assessment due to the nature of the visit.   ASSESSMENT & PLAN:   Acute non-recurrent frontal sinusitis Assessment & Plan: Acute frontal sinusitis Symptoms began on Sunday. Prescribed Z-Pak due to upcoming weekend. No antibiotic allergies. - Prescribed Z-Pak (azithromycin). - Advised increased fluid intake and rest.  Orders: -     Azithromycin; 2 DAILY FOR FIRST DAY, THEN DECREASE TO ONE DAILY  FOR 4 MORE DAYS.  Dispense: 6 tablet; Refill: 0  Wheezing Assessment & Plan: Wheezing and shortness of breath Reports wheezing and shortness of breath. No current inhaler use. Further evaluation if symptoms worsen to rule out pneumonia. - Advised follow-up with primary care for increased symptoms. - Consider chest x-ray if pneumonia suspected. - Consider inhaler if symptoms worsen.       Follow Up:  In Person prn  Signed, Harrie Cedar, FNP [facility] [phone]

## 2024-06-07 DIAGNOSIS — H6992 Unspecified Eustachian tube disorder, left ear: Secondary | ICD-10-CM | POA: Diagnosis not present

## 2024-06-07 DIAGNOSIS — Z9622 Myringotomy tube(s) status: Secondary | ICD-10-CM | POA: Diagnosis not present

## 2024-06-12 ENCOUNTER — Other Ambulatory Visit: Payer: Self-pay | Admitting: Family Medicine

## 2024-06-12 DIAGNOSIS — D72829 Elevated white blood cell count, unspecified: Secondary | ICD-10-CM

## 2024-06-12 DIAGNOSIS — E782 Mixed hyperlipidemia: Secondary | ICD-10-CM

## 2024-06-14 ENCOUNTER — Other Ambulatory Visit

## 2024-06-14 DIAGNOSIS — D72829 Elevated white blood cell count, unspecified: Secondary | ICD-10-CM

## 2024-06-14 DIAGNOSIS — E782 Mixed hyperlipidemia: Secondary | ICD-10-CM

## 2024-06-22 ENCOUNTER — Ambulatory Visit: Admitting: Family Medicine

## 2024-06-26 ENCOUNTER — Ambulatory Visit: Admitting: Family Medicine

## 2024-06-26 ENCOUNTER — Telehealth: Payer: Self-pay

## 2024-06-26 NOTE — Telephone Encounter (Signed)
 LVM for return call to reschedule labs and also the missed appointment today.

## 2024-07-22 NOTE — Progress Notes (Signed)
 Hx of TIA- 04/10/2024; 1 year min waiting period per [facility] medical guidelines.; and neurology clearance.

## 2024-07-24 ENCOUNTER — Telehealth: Payer: Self-pay

## 2024-07-24 NOTE — Telephone Encounter (Signed)
 Copied from CRM [id number]. Topic: Referral - Request for Referral >> Jul 24, 2024 11:00 AM Amy B wrote: Did the patient discuss referral with their provider in the last year? Yes (If No - schedule appointment) (If Yes - send message)  Appointment offered? No  Type of order/referral and detailed reason for visit: Neurology  Preference of office, provider, location:  No preference  If referral order, have you been seen by this specialty before? No (If Yes, this issue or another issue? When? Where?  Can we respond through MyChart? Yes

## 2024-07-25 ENCOUNTER — Other Ambulatory Visit: Payer: Self-pay | Admitting: Family Medicine

## 2024-07-25 DIAGNOSIS — G459 Transient cerebral ischemic attack, unspecified: Secondary | ICD-10-CM

## 2024-07-26 ENCOUNTER — Encounter: Payer: Self-pay | Admitting: Neurology

## 2024-08-07 NOTE — Progress Notes (Unsigned)
 "  NEUROLOGY CONSULTATION NOTE  Paul Newton MRN: [id number] DOB: [birth year]  Referring provider: Toribio Slain, MD Primary care provider: Toribio Slain, MD  Reason for consult:  TIA  Assessment/Plan:   Recurrent episode of left arm and face tinging - with associated headache, migraine with aura is certainly a consideration.  However, he does have severe right MCA stenosis which may be a cause of symptoms as well.  With these findings, I cannot rule out TIA due to right MCA stenosis.  Regardless of whether these events were TIAs or migraine, he must be treated with optimal medical management given the intracranial stenosis: Recommended restarting ASA 81mg  daily LDL goal less than 70.  Recheck lipid panel and start statin accordingly Normotensive blood pressure Hgb A1c goal less than 7 Encouraged to continue smoking cessation Mediterranean diet Routine exercise Follow up 6 months.  Total time spent on today's visit was 63 minutes dedicated to this patient today, preparing to see patient, examining the patient, ordering tests and/or medications and counseling the patient, documenting clinical information in the EHR or other health record, independently interpreting results and communicating results to the patient/family, discussing treatment and goals, answering patient's questions and coordinating care.    Subjective:  Paul Newton is a 44 year old right-handed male with past medical history of hypertension, hyperlipidemia, emphysema and GERD who presents for transient ischemic attack.  History supplemented by hospital records.   Discussed the use of AI scribe software for clinical note transcription with the patient, who gave verbal consent to proceed.  History of Present Illness Paul Newton is a 44 year old male with migraine, hypertriglyceridemia, and cerebral atherosclerosis presenting for evaluation of episodic left-sided sensory symptoms and headache.  On April 10, 2024, he experienced acute onset chest pain, mild tingling in the left arm and left V2 distribution of his face, and headache localized behind the left eye. Hospital notes mentioned left arm weakness but patient denies this.  These symptoms lasted approximately one hour. He presented to [facility], where he underwent stroke workup.  CT and MRI of brain were unremarkable.  CTA of neck showed no dissection or stenosis but CTA of head revealed severe stenosis within the right middle cerebral artery proximal-to-mid M2 branch and moderate-to-severe stenosis within the left posterior cerebral artery P2 segement.  CTA chest negative for dissection and acute cardiac etiology ruled out.   Echo showed EF 60-65% with negative bubble study.  Lipid panel revealed chol 161, TG 187, HDL 37 and LDL 87.  HIV negative.  UDS positive only for cannabinoid.  Other than cigarette smoking, he had no stroke risk factors.  He was started on DAPT (ASA and Plavix ) for 21 days followed by ASA alone as well as started on atorvastatin .  He was started on cholesterol medication and a blood thinner.  On April 20, 2024, he had a recurrence of similar symptoms, including left arm tingling and headache behind the left eye.  He was seen in the ED where repeat MRI of the brain performed was negative.  Migraine was suspected, so he was treated with a migraine cocktail with resolution of symptoms.  Since that episode, he has not experienced further events of similar severity.  Since then, he has discontinued both ASA, Plavix  and statin.    He describes a one-year history of recurrent headaches, typically localized behind the left eye, occurring about once per week prior to recent lifestyle changes. The headaches lasted one to two hours, were not  associated with nausea, photophobia, or phonophobia, and generally resolved with rest. He reports improvement in headache frequency following cessation of cigarette smoking and increased  exercise, with no significant headaches since making these changes.  He continues to experience occasional mild tingling in the left arm, described as intermittent and not associated with specific positions or activities. The tingling is less severe than during the initial episodes and is not accompanied by weakness, facial droop, or loss of consciousness.  He previously smoked cigarettes but quit after the recent health events. He has made additional lifestyle changes, including regular exercise. 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 He denies anxiety.   PAST MEDICAL HISTORY: Past Medical History:  Diagnosis Date   Complication of anesthesia    slow to wake up   COVID 2020   mild case   GERD (gastroesophageal reflux disease)    Pneumonia     PAST SURGICAL HISTORY: Past Surgical History:  Procedure Laterality Date   CLOSED REDUCTION METACARPAL WITH PERCUTANEOUS PINNING Right 03/18/2022   Procedure: closed reduction percutaneous pinning right ring finger and right small finger metacarpal fracture;  Surgeon: Sissy Cough, MD;  Location: [facility] OR;  Service: Orthopedics;  Laterality: Right;  Axillary block/ mac   LYMPHADENECTOMY     Right neck R/T swelling with airway compromise during bout with pneumonia    MEDICATIONS: Medications Ordered Prior to Encounter[1]  ALLERGIES: Allergies[2]  FAMILY HISTORY: Family History  Problem Relation Age of Onset   Arthritis Mother     Objective:  Blood pressure (!) 143/109, pulse (!) 108, height 5' 4 (1.626 m), weight 211 lb 9.6 oz (96 kg), SpO2 98%. General: No acute distress.  Patient appears well-groomed.   Head:  Normocephalic/atraumatic Eyes:  fundi examined but not visualized Neck: supple, no paraspinal tenderness, full range of motion Heart: regular rate and rhythm Neurological Exam: Mental status: alert and oriented to person, place, and time, speech  fluent and not dysarthric, language intact. Cranial nerves: CN I: not tested CN II: pupils equal, round and reactive to light, visual fields intact CN III, IV, VI:  full range of motion, no nystagmus, no ptosis CN V: facial sensation intact. CN VII: upper and lower face symmetric CN VIII: hearing intact CN IX, X: gag intact, uvula midline CN XI: sternocleidomastoid and trapezius muscles intact CN XII: tongue midline Bulk & Tone: normal, no fasciculations. Motor:  muscle strength 5/5 throughout Sensation:  Pinprick and vibratory sensation intact. Deep Tendon Reflexes:  2+ throughout,  toes downgoing.   Finger to nose testing:  Without dysmetria.   Gait:  Normal station and stride.  Romberg negative.    Juliene Dunnings, DO  CC: Toribio Slain, MD        [1]  Current Outpatient Medications on File Prior to Visit  Medication Sig Dispense Refill   atorvastatin  (LIPITOR) 40 MG tablet Take 1 tablet (40 mg total) by mouth daily. 90 tablet 0   azithromycin  (ZITHROMAX ) 250 MG tablet 2 DAILY FOR FIRST DAY, THEN DECREASE TO ONE DAILY FOR 4 MORE DAYS. 6 tablet 0   ondansetron  (ZOFRAN -ODT) 4 MG disintegrating tablet Take 1 tablet (4 mg total) by mouth every 8 (eight) hours as needed for nausea or vomiting. 12 tablet 0   pantoprazole  (PROTONIX ) 40 MG tablet Take 1 tablet (40 mg total) by mouth daily for 14 days. 14 tablet 0   [DISCONTINUED] dicyclomine  (BENTYL ) 20 MG tablet Take 1  tablet (20 mg total) by mouth 2 (two) times daily. 50 tablet 0   No current facility-administered medications on file prior to visit.  [2]  Allergies Allergen Reactions   Hyoscyamine Other (See Comments)    Felt like he couldn't swallow- made his throat unusually dry    "

## 2024-08-08 ENCOUNTER — Other Ambulatory Visit: Payer: Self-pay

## 2024-08-08 ENCOUNTER — Ambulatory Visit: Payer: Self-pay | Admitting: Neurology

## 2024-08-08 ENCOUNTER — Encounter: Payer: Self-pay | Admitting: Neurology

## 2024-08-08 VITALS — BP 143/109 | HR 108 | Ht 64.0 in | Wt 211.6 lb

## 2024-08-08 DIAGNOSIS — G459 Transient cerebral ischemic attack, unspecified: Secondary | ICD-10-CM

## 2024-08-08 DIAGNOSIS — I6601 Occlusion and stenosis of right middle cerebral artery: Secondary | ICD-10-CM

## 2024-08-08 NOTE — Patient Instructions (Addendum)
 Start aspirin  81mg  daily I would like to check a lipid panel and start a cholesterol medication based on results Continue no smoking Mediterranean diet (see below) Routine exercise   Mediterranean Diet A Mediterranean diet is based on the traditions of countries on the [employer]. It focuses on eating more: Fruits and vegetables. Whole grains, beans, nuts, and seeds. Heart-healthy fats. These are fats that are good for your heart. It involves eating less: Dairy. Meat and eggs. Processed foods with added sugar, salt, and fat. This type of diet can help prevent certain conditions. It can also improve outcomes if you have a long-term (chronic) disease, such as kidney or heart disease. What are tips for following this plan? Reading food labels Check packaged foods for: The serving size. For foods such as rice and pasta, the serving size is the amount of cooked product, not dry. The total fat. Avoid foods with saturated fat or trans fat. Added sugars, such as corn syrup. Shopping  Try to have a balanced diet. Buy a variety of foods, such as: Fresh fruits and vegetables. You may be able to get these from local farmers markets. You can also buy them frozen. Grains, beans, nuts, and seeds. Some of these can be bought in bulk. Fresh seafood. Poultry and eggs. Low-fat dairy products. Buy whole ingredients instead of foods that have already been packaged. If you can't get fresh seafood, buy precooked frozen shrimp or canned fish, such as tuna, salmon, or sardines. Stock your pantry so you always have certain foods on hand, such as olive oil, canned tuna, canned tomatoes, rice, pasta, and beans. Cooking Cook foods with extra-virgin olive oil instead of using butter or other vegetable oils. Have meat as a side dish. Have vegetables or grains as your main dish. This means having meat in small portions or adding small amounts of meat to foods like pasta or stew. Use beans or vegetables  instead of meat in common dishes like chili or lasagna. Try out different cooking methods. Try roasting, broiling, steaming, and sauting vegetables. Add frozen vegetables to soups, stews, pasta, or rice. Add nuts or seeds for added healthy fats and plant protein at each meal. You can add these to yogurt, salads, or vegetable dishes. Marinate fish or vegetables using olive oil, lemon juice, garlic, and fresh herbs. Meal planning Plan to eat a vegetarian meal one day each week. Try to work up to two vegetarian meals, if possible. Eat seafood two or more times a week. Have healthy snacks on hand. These may include: Vegetable sticks with hummus. Greek yogurt. Fruit and nut trail mix. Eat balanced meals. These should include: Fruit: 2-3 servings a day. Vegetables: 4-5 servings a day. Low-fat dairy: 2 servings a day. Fish, poultry, or lean meat: 1 serving a day. Beans and legumes: 2 or more servings a week. Nuts and seeds: 1-2 servings a day. Whole grains: 6-8 servings a day. Extra-virgin olive oil: 3-4 servings a day. Limit red meat and sweets to just a few servings a month. Lifestyle  Try to cook and eat meals with your family. Drink enough fluid to keep your pee (urine) pale yellow. Be active every day. This includes: Aerobic exercise, which is exercise that causes your heart to beat faster. Examples include running and swimming. Leisure activities like gardening, walking, or housework. Get 7-8 hours of sleep each night. Drink red wine if your provider says you can. A glass of wine is 5 oz (150 mL). You may be allowed to  have: Up to 1 glass a day if you're male and not pregnant. Up to 2 glasses a day if you're male. What foods should I eat? Fruits Apples. Apricots. Avocado. Berries. Bananas. Cherries. Dates. Figs. Grapes. Lemons. Melon. Oranges. Peaches. Plums. Pomegranate. Vegetables Artichokes. Beets. Broccoli. Cabbage. Carrots. Eggplant. Green beans. Chard. Kale. Spinach.  Onions. Leeks. Peas. Squash. Tomatoes. Peppers. Radishes. Grains Whole-grain pasta. Brown rice. Bulgur wheat. Polenta. Couscous. Whole-wheat bread. [place] Madeira. Meats and other proteins Beans. Almonds. Sunflower seeds. Pine nuts. Peanuts. Cod. Salmon. Scallops. Shrimp. Tuna. Tilapia. Clams. Oysters. Eggs. Chicken or turkey without skin. Dairy Low-fat milk. Cheese. Greek yogurt. Fats and oils Extra-virgin olive oil. Avocado oil. Grapeseed oil. Beverages Water. Red wine. Herbal tea. Sweets and desserts Greek yogurt with honey. Baked apples. Poached pears. Trail mix. Seasonings and condiments Basil. Cilantro. Coriander. Cumin. Mint. Parsley. Sage. Rosemary. Tarragon. Garlic. Oregano. Thyme. Pepper. Balsamic vinegar. Tahini. Hummus. Tomato sauce. Olives. Mushrooms. The items listed above may not be all the foods and drinks you can have. Talk to a dietitian to learn more. What foods should I limit? This is a list of foods that should be eaten rarely. Fruits Fruit canned in syrup. Vegetables Deep-fried potatoes, like French fries. Grains Packaged pasta or rice dishes. Cereal with added sugar. Snacks with added sugar. Meats and other proteins Beef. Pork. Lamb. Chicken or turkey with skin. Hot dogs. [place]. Dairy Ice cream. Sour cream. Whole milk. Fats and oils Butter. Canola oil. Vegetable oil. Beef fat (tallow). Lard. Beverages Juice. Sugar-sweetened soft drinks. Beer. Liquor and spirits. Sweets and desserts Cookies. Cakes. Pies. Candy. Seasonings and condiments Mayonnaise. Pre-made sauces and marinades. The items listed above may not be all the foods and drinks you should limit. Talk to a dietitian to learn more. Where to find more information American Heart Association (AHA): heart.org This information is not intended to replace advice given to you by your health care provider. Make sure you discuss any questions you have with your health care provider. Document Revised:  10/18/2022 Document Reviewed: 10/18/2022 Elsevier Patient Education  2024 [employer].

## 2024-08-09 ENCOUNTER — Ambulatory Visit: Payer: Self-pay | Admitting: Neurology

## 2024-08-09 ENCOUNTER — Other Ambulatory Visit: Payer: Self-pay

## 2024-08-09 LAB — LIPID PANEL
Cholesterol: 214 mg/dL — ABNORMAL HIGH
HDL: 39 mg/dL — ABNORMAL LOW
LDL Cholesterol (Calc): 150 mg/dL — ABNORMAL HIGH
Non-HDL Cholesterol (Calc): 175 mg/dL — ABNORMAL HIGH
Total CHOL/HDL Ratio: 5.5 (calc) — ABNORMAL HIGH
Triglycerides: 124 mg/dL

## 2024-08-09 MED ORDER — ATORVASTATIN CALCIUM 80 MG PO TABS: 80.0000 mg | ORAL_TABLET | Freq: Every day | ORAL | 5 refills | Status: AC

## 2024-08-09 NOTE — Telephone Encounter (Signed)
 Paul Newton stated he was returning a call from the office. He is expecting a call back.   PH: [phone]

## 2024-08-10 ENCOUNTER — Other Ambulatory Visit: Payer: Self-pay

## 2024-08-10 DIAGNOSIS — I6601 Occlusion and stenosis of right middle cerebral artery: Secondary | ICD-10-CM

## 2024-08-10 DIAGNOSIS — G459 Transient cerebral ischemic attack, unspecified: Secondary | ICD-10-CM

## 2024-10-16 ENCOUNTER — Other Ambulatory Visit

## 2025-02-16 ENCOUNTER — Ambulatory Visit: Payer: Self-pay | Admitting: Neurology
